# Patient Record
Sex: Female | Born: 1985
Health system: Southern US, Community
[De-identification: ages and names within clinical notes are randomized; demographics above are authoritative.]

## PROBLEM LIST (undated history)

## (undated) DIAGNOSIS — D219 Benign neoplasm of connective and other soft tissue, unspecified: Secondary | ICD-10-CM

## (undated) DIAGNOSIS — O1490 Unspecified pre-eclampsia, unspecified trimester: Secondary | ICD-10-CM

## (undated) DIAGNOSIS — B009 Herpesviral infection, unspecified: Secondary | ICD-10-CM

## (undated) HISTORY — DX: Unspecified pre-eclampsia, unspecified trimester: O14.90

## (undated) HISTORY — DX: Benign neoplasm of connective and other soft tissue, unspecified: D21.9

## (undated) HISTORY — PX: NO PAST SURGERIES: SHX2092

## (undated) HISTORY — DX: Herpesviral infection, unspecified: B00.9

---

## 2017-04-24 ENCOUNTER — Emergency Department (HOSPITAL_COMMUNITY)
Admission: EM | Admit: 2017-04-24 | Discharge: 2017-04-24 | Disposition: A | Discharge status: Home / Self Care | Payer: Medicaid Other | Attending: Emergency Medicine | Admitting: Emergency Medicine

## 2017-04-24 ENCOUNTER — Encounter (HOSPITAL_COMMUNITY): Payer: Self-pay

## 2017-04-24 DIAGNOSIS — I10 Essential (primary) hypertension: Secondary | ICD-10-CM | POA: Insufficient documentation

## 2017-04-24 DIAGNOSIS — K0889 Other specified disorders of teeth and supporting structures: Secondary | ICD-10-CM

## 2017-04-24 DIAGNOSIS — K029 Dental caries, unspecified: Secondary | ICD-10-CM | POA: Diagnosis not present

## 2017-04-24 DIAGNOSIS — F17210 Nicotine dependence, cigarettes, uncomplicated: Secondary | ICD-10-CM | POA: Insufficient documentation

## 2017-04-24 MED ORDER — BUPIVACAINE HCL (PF) 0.25 % IJ SOLN: 2.0000 mL | Freq: Once | INTRAMUSCULAR | Status: DC

## 2017-04-24 MED ORDER — BUPIVACAINE HCL (PF) 0.5 % IJ SOLN
2.0000 mL | Freq: Once | INTRAMUSCULAR | Status: AC
  Administered 2017-04-24: 2 mL
  Filled 2017-04-24: qty 10

## 2017-04-24 MED ORDER — ACETAMINOPHEN 500 MG PO TABS
1000.0000 mg | ORAL_TABLET | Freq: Once | ORAL | Status: AC
  Administered 2017-04-24: 1000 mg via ORAL
  Filled 2017-04-24: qty 2

## 2017-04-24 MED ORDER — PENICILLIN V POTASSIUM 500 MG PO TABS: 500.0000 mg | ORAL_TABLET | Freq: Four times a day (QID) | ORAL | 0 refills | Status: AC

## 2017-04-24 MED ORDER — BUPIVACAINE HCL (PF) 0.5 % IJ SOLN
2.0000 mL | Freq: Once | INTRAMUSCULAR | Status: DC
  Filled 2017-04-24: qty 10

## 2017-04-24 MED ORDER — KETOROLAC TROMETHAMINE 60 MG/2ML IM SOLN
60.0000 mg | Freq: Once | INTRAMUSCULAR | Status: AC
  Administered 2017-04-24: 60 mg via INTRAMUSCULAR
  Filled 2017-04-24 (×2): qty 2

## 2017-04-24 NOTE — ED Triage Notes (Signed)
Pt presents with L lower tooth pain x 2 days.  Pt applied clove oil that numbed area for a short period, reports tooth is cracked.

## 2017-04-24 NOTE — Discharge Instructions (Signed)
Take tylenol 1000mg  4 times a day. (maximum tylenol from all sources) and ibuprofen 400mg  4-6 times daily.

## 2017-04-27 NOTE — ED Provider Notes (Signed)
[facility] Provider Note   CSN: [id number] Arrival date & time: 04/24/17  1143     History   Chief Complaint Chief Complaint  Patient presents with  . Dental Pain    HPI Alicia Spears is a 31 y.o. female.  HPI   31yo female presents with concern for dental pain. Pain has been present for 2 days. It is located lower left molar.  It is a throbbing pain, severe. Unable to eat on that side of her mouth.  Worse with pressure on the area. Pain radiates up towards left ear.  Visiting from [place].   Past Medical History:  Diagnosis Date  . Hypertension     There are no active problems to display for this patient.   History reviewed. No pertinent surgical history.  OB History    No data available       Home Medications    Prior to Admission medications   Medication Sig Start Date End Date Taking? Authorizing Provider  penicillin v potassium (VEETID) 500 MG tablet Take 1 tablet (500 mg total) by mouth 4 (four) times daily. 04/24/17 05/01/17  Gareth Morgan, MD    Family History History reviewed. No pertinent family history.  Social History Social History  Substance Use Topics  . Smoking status: Current Every Day Smoker    Packs/day: 0.50  . Smokeless tobacco: Never Used  . Alcohol use Not on file     Allergies   Patient has no known allergies.   Review of Systems Review of Systems  Constitutional: Negative for fever.  HENT: Positive for dental problem and ear pain. Negative for sore throat, trouble swallowing and voice change.   Eyes: Negative for visual disturbance.  Respiratory: Negative for cough.   Cardiovascular: Negative for chest pain.  Gastrointestinal: Negative for nausea and vomiting.  Genitourinary: Negative for difficulty urinating.  Skin: Negative for rash.  Neurological: Negative for syncope and headaches.     Physical Exam Updated Vital Signs BP 125/80 (BP Location: Right Arm)   Pulse 75   Temp 98.8 F (37.1 C)  (Oral)   Resp 16   Ht 5\' 4"  (1.626 m)   Wt 69.9 kg (154 lb)   LMP 03/28/2017 (Approximate)   SpO2 100%   BMI 26.43 kg/m   Physical Exam  Constitutional: She is oriented to person, place, and time. She appears well-developed and well-nourished. No distress.  HENT:  Head: Normocephalic and atraumatic.  Right Ear: Tympanic membrane is not erythematous.  Left Ear: Tympanic membrane is not erythematous.  Mouth/Throat: Dental caries present. No oropharyngeal exudate.  Severe caries #17, tenderness, no sublingual swelling, no trismus  Eyes: Conjunctivae and EOM are normal.  Neck: Normal range of motion.  Cardiovascular: Normal rate, regular rhythm, normal heart sounds and intact distal pulses.  Exam reveals no gallop and no friction rub.   No murmur heard. Pulmonary/Chest: Effort normal and breath sounds normal. No respiratory distress. She has no wheezes. She has no rales.  Neurological: She is alert and oriented to person, place, and time.  Skin: Skin is warm and dry. No rash noted. She is not diaphoretic. No erythema.  Nursing note and vitals reviewed.    ED Treatments / Results  Labs (all labs ordered are listed, but only abnormal results are displayed) Labs Reviewed - No data to display  EKG  EKG Interpretation None       Radiology No results found.  Procedures Dental Block Date/Time: 04/27/2017 7:18 AM Performed by: Gareth Morgan  Authorized by: Gareth Morgan   Consent:    Consent obtained:  Verbal   Consent given by:  Patient   Risks discussed:  Hematoma, infection, swelling, nerve damage, pain, unsuccessful block, allergic reaction and intravascular injection   Alternatives discussed:  Alternative treatment Indications:    Indications: dental pain   Location:    Block type:  Inferior alveolar   Laterality:  Left Procedure details (see MAR for exact dosages):    Needle gauge:  25 G   Anesthetic injected:  Bupivacaine 0.25% WITH epi   Injection  procedure:  Anatomic landmarks identified, introduced needle, anatomic landmarks palpated, negative aspiration for blood and incremental injection Post-procedure details:    Outcome:  Pain improved   Patient tolerance of procedure:  Tolerated well, no immediate complications   (including critical care time)  Medications Ordered in ED Medications  acetaminophen (TYLENOL) tablet 1,000 mg (1,000 mg Oral Given 04/24/17 1325)  ketorolac (TORADOL) injection 60 mg (60 mg Intramuscular Given 04/24/17 1347)  bupivacaine (MARCAINE) 0.5 % injection 2 mL (2 mLs Infiltration Given 04/24/17 1350)     Initial Impression / Assessment and Plan / ED Course  I have reviewed the triage vital signs and the nursing notes.  Pertinent labs & imaging results that were available during my care of the patient were reviewed by me and considered in my medical decision making (see chart for details).     32yo female presents with concern for dental pain.  Given toradol, tylenol, performed dental block with some relief. Severe caries, suspect periapical abscess by history. No signs of drainable abscess, nor extension of infection on hx or exam. Given rx for penicillin. Recommend ibuprofen/tylenol for pain. Provided dental resources. Patient discharged in stable condition with understanding of reasons to return.   Final Clinical Impressions(s) / ED Diagnoses   Final diagnoses:  Pain, dental  Dental caries    New Prescriptions Discharge Medication List as of 04/24/2017 12:46 PM    START taking these medications   Details  penicillin v potassium (VEETID) 500 MG tablet Take 1 tablet (500 mg total) by mouth 4 (four) times daily., Starting Fri 04/24/2017, Until Fri 05/01/2017, Print         Gareth Morgan, MD 04/27/17 [phone]

## 2018-09-30 ENCOUNTER — Ambulatory Visit (INDEPENDENT_AMBULATORY_CARE_PROVIDER_SITE_OTHER): Payer: Self-pay | Admitting: Physician Assistant

## 2018-10-25 ENCOUNTER — Encounter: Payer: Self-pay | Admitting: Obstetrics & Gynecology

## 2018-10-26 ENCOUNTER — Other Ambulatory Visit: Payer: Self-pay

## 2018-10-26 ENCOUNTER — Encounter (INDEPENDENT_AMBULATORY_CARE_PROVIDER_SITE_OTHER): Payer: Self-pay | Admitting: Family Medicine

## 2018-10-26 ENCOUNTER — Ambulatory Visit (INDEPENDENT_AMBULATORY_CARE_PROVIDER_SITE_OTHER): Payer: Self-pay | Admitting: Family Medicine

## 2018-10-26 VITALS — BP 130/76 | HR 82 | Temp 98.2°F | Ht 65.0 in | Wt 131.8 lb

## 2018-10-26 DIAGNOSIS — R7303 Prediabetes: Secondary | ICD-10-CM

## 2018-10-26 DIAGNOSIS — I10 Essential (primary) hypertension: Secondary | ICD-10-CM | POA: Insufficient documentation

## 2018-10-26 DIAGNOSIS — Z79899 Other long term (current) drug therapy: Secondary | ICD-10-CM

## 2018-10-26 DIAGNOSIS — O169 Unspecified maternal hypertension, unspecified trimester: Secondary | ICD-10-CM | POA: Insufficient documentation

## 2018-10-26 DIAGNOSIS — Z131 Encounter for screening for diabetes mellitus: Secondary | ICD-10-CM

## 2018-10-26 DIAGNOSIS — D500 Iron deficiency anemia secondary to blood loss (chronic): Secondary | ICD-10-CM

## 2018-10-26 LAB — POCT GLYCOSYLATED HEMOGLOBIN (HGB A1C): Hemoglobin A1C: 5.7 % — AB (ref 4.0–5.6)

## 2018-10-26 MED ORDER — HYDROCHLOROTHIAZIDE 12.5 MG PO TABS: 12.5000 mg | ORAL_TABLET | Freq: Every day | ORAL | 6 refills | Status: DC

## 2018-10-26 MED ORDER — LOSARTAN POTASSIUM 25 MG PO TABS: 25.0000 mg | ORAL_TABLET | Freq: Every day | ORAL | 6 refills | Status: DC

## 2018-10-26 MED ORDER — NIFEDIPINE ER OSMOTIC RELEASE 30 MG PO TB24: 30.0000 mg | ORAL_TABLET | Freq: Every day | ORAL | 6 refills | Status: DC

## 2018-10-26 NOTE — Progress Notes (Signed)
Subjective:    Patient ID: Alicia Spears, female    DOB: September 28, 1986, 33 y.o.   MRN: [id number]  HPI       33 yo female new to the practice.  Patient reports that she has high blood pressure which developed during pregnancy initially.  Patient is currently on losartan, hydrochlorothiazide and nifedipine.  Patient also reports history of HSV for which she takes Valtrex.  Patient would also like to be screened for diabetes/prediabetes.  Patient states that overall she feels well at today's visit.  Patient denies any headaches or dizziness related to her blood pressure.  Patient does have some occasional fatigue.  Patient does have a history of anemia related to heavy menses as patient also has uterine fibroids.  Patient denies any increased thirst or urinary frequency.  No current abdominal pain, no nausea/vomiting/diarrhea or constipation.  Past Medical History:  Diagnosis Date  . Fibroids   . Herpes simplex virus (HSV) infection   . Hypertension in pregnancy, preeclampsia    Past Surgical History:  Procedure Laterality Date  . NO PAST SURGERIES     Family History  Problem Relation Age of Onset  . Hypertension Mother   . CVA Mother   . Colon cancer Paternal Grandfather    Social History   Tobacco Use  . Smoking status: Current Every Day Smoker    Packs/day: 0.50  . Smokeless tobacco: Never Used  Substance Use Topics  . Alcohol use: Yes    Comment: occasionally  . Drug use: Not Currently    Types: Marijuana  . No Known Allergies    Review of Systems  Constitutional: Positive for fatigue. Negative for chills and fever.  HENT: Negative for hearing loss and trouble swallowing.   Respiratory: Negative for cough and shortness of breath.   Cardiovascular: Negative for chest pain, palpitations and leg swelling.  Gastrointestinal: Negative for abdominal pain, constipation, diarrhea and nausea.  Endocrine: Negative for polydipsia, polyphagia and polyuria.  Genitourinary: Negative  for dysuria and frequency.  Musculoskeletal: Negative for arthralgias, back pain and gait problem.  Neurological: Negative for dizziness and headaches.  Hematological: Negative for adenopathy. Does not bruise/bleed easily.       Objective:   Physical Exam Vitals signs and nursing note reviewed.  Constitutional:      Appearance: Normal appearance. She is normal weight.  HENT:     Right Ear: Tympanic membrane, ear canal and external ear normal.     Left Ear: Tympanic membrane, ear canal and external ear normal.     Nose: Nose normal. No congestion.     Mouth/Throat:     Mouth: Mucous membranes are moist.     Pharynx: Oropharynx is clear.  Eyes:     Extraocular Movements: Extraocular movements intact.     Conjunctiva/sclera: Conjunctivae normal.     Pupils: Pupils are equal, round, and reactive to light.  Neck:     Musculoskeletal: Normal range of motion and neck supple.  Cardiovascular:     Rate and Rhythm: Normal rate and regular rhythm.  Pulmonary:     Effort: Pulmonary effort is normal. No respiratory distress.     Breath sounds: Normal breath sounds.  Abdominal:     General: There is no distension.     Palpations: Abdomen is soft.     Tenderness: There is no abdominal tenderness. There is no right CVA tenderness, left CVA tenderness, guarding or rebound.  Musculoskeletal: Normal range of motion.        General:  No swelling or tenderness.     Right lower leg: No edema.     Left lower leg: No edema.  Skin:    General: Skin is warm and dry.  Neurological:     General: No focal deficit present.     Mental Status: She is alert and oriented to person, place, and time.  Psychiatric:        Mood and Affect: Mood normal.        Behavior: Behavior normal.        Thought Content: Thought content normal.        Judgment: Judgment normal.    BP 130/76 (BP Location: Right Arm, Patient Position: Sitting, Cuff Size: Normal)   Pulse 82   Temp 98.2 F (36.8 C) (Oral)   Ht 5\' 5"   (1.651 m)   Wt 131 lb 12.8 oz (59.8 kg)   LMP 10/15/2018 (Approximate)   SpO2 100%   BMI 21.93 kg/m         Assessment & Plan:  1. Essential hypertension Patient will have complete metabolic panel and lipid panel in follow-up of hypertension.  Patient's blood pressure is well controlled at 130/76 with goal blood pressure being 130/80.  Patient will continue her current nifedipine, losartan and hydrochlorothiazide.  DASH diet also recommended. - Comprehensive metabolic panel - Lipid Panel - NIFEdipine (PROCARDIA-XL/NIFEDICAL-XL) 30 MG 24 hr tablet; Take 1 tablet (30 mg total) by mouth at bedtime.  Dispense: 30 tablet; Refill: 6 - losartan (COZAAR) 25 MG tablet; Take 1 tablet (25 mg total) by mouth daily.  Dispense: 30 tablet; Refill: 6 - hydrochlorothiazide (HYDRODIURIL) 12.5 MG tablet; Take 1 tablet (12.5 mg total) by mouth daily.  Dispense: 30 tablet; Refill: 6  2. Screening for diabetes mellitus Patient with hypertension and requested screening for diabetes.  Patient with hemoglobin A1c of 5.7 and discussed with the patient that normal hemoglobin A1c is 4.0-5.6 and that a value of 5.7 can indicate an increased future risk of becoming diabetic - HgB A1c  3. Iron deficiency anemia due to chronic blood loss Patient reports history of iron deficiency anemia due to blood loss and patient with history of uterine fibroids.  Patient will have CBC at today's visit and will be notified if further intervention such as iron therapy are warranted based on her results - CBC with Differential  4. Prediabetes Patient with hemoglobin A1c of 5.7 which indicates an increased risk of patient becoming diabetic.  Patient would like to try making changes in her diet such as eliminating concentrated sweets, sodas and juices.  Suggest 52-month repeat of hemoglobin A1c and if A1c is further elevated at that time, patient should consider medication such as metformin to help with glucose regulation.  Patient will  have CMP in follow-up of prediabetes at today's visit - Comprehensive metabolic panel  5. Encounter for long-term (current) use of medications Patient will have CMP at today's visit in follow-up of long-term use of medications for the treatment of hypertension - Comprehensive metabolic panel  *Influenza immunization was offered at today's visit but declined by the patient  An After Visit Summary was printed and given to the patient. Allergies as of 10/26/2018   No Known Allergies     Medication List       Accurate as of October 26, 2018 11:59 PM. Always use your most recent med list.        hydrochlorothiazide 12.5 MG tablet Commonly known as:  HYDRODIURIL Take 1 tablet (12.5 mg total) by  mouth daily.   losartan 25 MG tablet Commonly known as:  COZAAR Take 1 tablet (25 mg total) by mouth daily.   NIFEdipine 30 MG 24 hr tablet Commonly known as:  PROCARDIA-XL/NIFEDICAL-XL Take 1 tablet (30 mg total) by mouth at bedtime.   valACYclovir 500 MG tablet Commonly known as:  VALTREX Take 500 mg by mouth 2 (two) times daily.      Return in about 6 months (around 04/26/2019) for HTN, prediabetes, anemia.

## 2018-10-26 NOTE — Patient Instructions (Signed)
Prediabetes Prediabetes is the condition of having a blood sugar (blood glucose) level that is higher than it should be, but not high enough for you to be diagnosed with type 2 diabetes. Having prediabetes puts you at risk for developing type 2 diabetes (type 2 diabetes mellitus). Prediabetes may be called impaired glucose tolerance or impaired fasting glucose. Prediabetes usually does not cause symptoms. Your health care provider can diagnose this condition with blood tests. You may be tested for prediabetes if you are overweight and if you have at least one other risk factor for prediabetes. What is blood glucose, and how is it measured? Blood glucose refers to the amount of glucose in your bloodstream. Glucose comes from eating foods that contain sugars and starches (carbohydrates), which the body breaks down into glucose. Your blood glucose level may be measured in mg/dL (milligrams per deciliter) or mmol/L (millimoles per liter). Your blood glucose may be checked with one or more of the following blood tests:  A fasting blood glucose (FBG) test. You will not be allowed to eat (you will fast) for 8 hours or longer before a blood sample is taken. ? A normal range for FBG is 70-100 mg/dl (3.9-5.6 mmol/L).  An A1c (hemoglobin A1c) blood test. This test provides information about blood glucose control over the previous 2?3months.  An oral glucose tolerance test (OGTT). This test measures your blood glucose at two times: ? After fasting. This is your baseline level. ? Two hours after you drink a beverage that contains glucose. You may be diagnosed with prediabetes:  If your FBG is 100?125 mg/dL (5.6-6.9 mmol/L).  If your A1c level is 5.7?6.4%.  If your OGTT result is 140?199 mg/dL (7.8-11 mmol/L). These blood tests may be repeated to confirm your diagnosis. How can this condition affect me? The pancreas produces a hormone (insulin) that helps to move glucose from the bloodstream into cells.  When cells in the body do not respond properly to insulin that the body makes (insulin resistance), excess glucose builds up in the blood instead of going into cells. As a result, high blood glucose (hyperglycemia) can develop, which can cause many complications. Hyperglycemia is a symptom of prediabetes. Having high blood glucose for a long time is dangerous. Too much glucose in your blood can damage your nerves and blood vessels. Long-term damage can lead to complications from diabetes, which may include:  Heart disease.  Stroke.  Blindness.  Kidney disease.  Depression.  Poor circulation in the feet and legs, which could lead to surgical removal (amputation) in severe cases. What can increase my risk? Risk factors for prediabetes include:  Having a family member with type 2 diabetes.  Being overweight or obese.  Being older than age 45.  Being of American Indian, African-American, Hispanic/Latino, or Asian/Pacific Islander descent.  Having an inactive (sedentary) lifestyle.  Having a history of heart disease.  History of gestational diabetes or polycystic ovary syndrome (PCOS), in women.  Having low levels of good cholesterol (HDL-C) or high levels of blood fats (triglycerides).  Having high blood pressure. What actions can I take to prevent diabetes?      Be physically active. ? Do moderate-intensity physical activity for 30 or more minutes on 5 or more days of the week, or as much as told by your health care provider. This could be brisk walking, biking, or water aerobics. ? Ask your health care provider what activities are safe for you. A mix of physical activities may be best, such as   walking, swimming, cycling, and strength training.  Lose weight as told by your health care provider. ? Losing 5-7% of your body weight can reverse insulin resistance. ? Your health care provider can determine how much weight loss is best for you and can help you lose weight  safely.  Follow a healthy meal plan. This includes eating lean proteins, complex carbohydrates, fresh fruits and vegetables, low-fat dairy products, and healthy fats. ? Follow instructions from your health care provider about eating or drinking restrictions. ? Make an appointment to see a diet and nutrition specialist (registered dietitian) to help you create a healthy eating plan that is right for you.  Do not smoke or use any tobacco products, such as cigarettes, chewing tobacco, and e-cigarettes. If you need help quitting, ask your health care provider.  Take over-the-counter and prescription medicines as told by your health care provider. You may be prescribed medicines that help lower the risk of type 2 diabetes.  Keep all follow-up visits as told by your health care provider. This is important. Summary  Prediabetes is the condition of having a blood sugar (blood glucose) level that is higher than it should be, but not high enough for you to be diagnosed with type 2 diabetes.  Having prediabetes puts you at risk for developing type 2 diabetes (type 2 diabetes mellitus).  To help prevent type 2 diabetes, make lifestyle changes such as being physically active and eating a healthy diet. Lose weight as told by your health care provider. This information is not intended to replace advice given to you by your health care provider. Make sure you discuss any questions you have with your health care provider. Document Released: 01/14/2016 Document Revised: 05/12/2017 Document Reviewed: 11/13/2015 Elsevier Interactive Patient Education  2019 [place] DASH stands for "Dietary Approaches to Stop Hypertension." The DASH eating plan is a healthy eating plan that has been shown to reduce high blood pressure (hypertension). It may also reduce your risk for type 2 diabetes, heart disease, and stroke. The DASH eating plan may also help with weight loss. What are tips for following  this plan?  General guidelines  Avoid eating more than 2,300 mg (milligrams) of salt (sodium) a day. If you have hypertension, you may need to reduce your sodium intake to 1,500 mg a day.  Limit alcohol intake to no more than 1 drink a day for nonpregnant women and 2 drinks a day for men. One drink equals 12 oz of beer, 5 oz of wine, or 1 oz of hard liquor.  Work with your health care provider to maintain a healthy body weight or to lose weight. Ask what an ideal weight is for you.  Get at least 30 minutes of exercise that causes your heart to beat faster (aerobic exercise) most days of the week. Activities may include walking, swimming, or biking.  Work with your health care provider or diet and nutrition specialist (dietitian) to adjust your eating plan to your individual calorie needs. Reading food labels   Check food labels for the amount of sodium per serving. Choose foods with less than 5 percent of the Daily Value of sodium. Generally, foods with less than 300 mg of sodium per serving fit into this eating plan.  To find whole grains, look for the word "whole" as the first word in the ingredient list. Shopping  Buy products labeled as "low-sodium" or "no salt added."  Buy fresh foods. Avoid canned foods and premade or  frozen meals. Cooking  Avoid adding salt when cooking. Use salt-free seasonings or herbs instead of table salt or sea salt. Check with your health care provider or pharmacist before using salt substitutes.  Do not fry foods. Cook foods using healthy methods such as baking, boiling, grilling, and broiling instead.  Cook with heart-healthy oils, such as olive, canola, soybean, or sunflower oil. Meal planning  Eat a balanced diet that includes: ? 5 or more servings of fruits and vegetables each day. At each meal, try to fill half of your plate with fruits and vegetables. ? Up to 6-8 servings of whole grains each day. ? Less than 6 oz of lean meat, poultry, or  fish each day. A 3-oz serving of meat is about the same size as a deck of cards. One egg equals 1 oz. ? 2 servings of low-fat dairy each day. ? A serving of nuts, seeds, or beans 5 times each week. ? Heart-healthy fats. Healthy fats called Omega-3 fatty acids are found in foods such as flaxseeds and coldwater fish, like sardines, salmon, and mackerel.  Limit how much you eat of the following: ? Canned or prepackaged foods. ? Food that is high in trans fat, such as fried foods. ? Food that is high in saturated fat, such as fatty meat. ? Sweets, desserts, sugary drinks, and other foods with added sugar. ? Full-fat dairy products.  Do not salt foods before eating.  Try to eat at least 2 vegetarian meals each week.  Eat more home-cooked food and less restaurant, buffet, and fast food.  When eating at a restaurant, ask that your food be prepared with less salt or no salt, if possible. What foods are recommended? The items listed may not be a complete list. Talk with your dietitian about what dietary choices are best for you. Grains Whole-grain or whole-wheat bread. Whole-grain or whole-wheat pasta. Brown rice. [place]. Bulgur. Whole-grain and low-sodium cereals. Pita bread. Low-fat, low-sodium crackers. Whole-wheat flour tortillas. Vegetables Fresh or frozen vegetables (raw, steamed, roasted, or grilled). Low-sodium or reduced-sodium tomato and vegetable juice. Low-sodium or reduced-sodium tomato sauce and tomato paste. Low-sodium or reduced-sodium canned vegetables. Fruits All fresh, dried, or frozen fruit. Canned fruit in natural juice (without added sugar). Meat and other protein foods Skinless chicken or [place]. Ground chicken or [place]. Pork with fat trimmed off. Fish and seafood. Egg whites. Dried beans, peas, or lentils. Unsalted nuts, nut butters, and seeds. Unsalted canned beans. Lean cuts of beef with fat trimmed off. Low-sodium, lean deli meat. Dairy Low-fat (1%) or  fat-free (skim) milk. Fat-free, low-fat, or reduced-fat cheeses. Nonfat, low-sodium ricotta or cottage cheese. Low-fat or nonfat yogurt. Low-fat, low-sodium cheese. Fats and oils Soft margarine without trans fats. Vegetable oil. Low-fat, reduced-fat, or light mayonnaise and salad dressings (reduced-sodium). Canola, safflower, olive, soybean, and sunflower oils. Avocado. Seasoning and other foods Herbs. Spices. Seasoning mixes without salt. Unsalted popcorn and pretzels. Fat-free sweets. What foods are not recommended? The items listed may not be a complete list. Talk with your dietitian about what dietary choices are best for you. Grains Baked goods made with fat, such as croissants, muffins, or some breads. Dry pasta or rice meal packs. Vegetables Creamed or fried vegetables. Vegetables in a cheese sauce. Regular canned vegetables (not low-sodium or reduced-sodium). Regular canned tomato sauce and paste (not low-sodium or reduced-sodium). Regular tomato and vegetable juice (not low-sodium or reduced-sodium). Angie Fava. Olives. Fruits Canned fruit in a light or heavy syrup. Fried fruit. Fruit in cream  or butter sauce. Meat and other protein foods Fatty cuts of meat. Ribs. Fried meat. Berniece Salines. Sausage. Bologna and other processed lunch meats. Salami. Fatback. Hotdogs. Bratwurst. Salted nuts and seeds. Canned beans with added salt. Canned or smoked fish. Whole eggs or egg yolks. Chicken or [place] with skin. Dairy Whole or 2% milk, cream, and half-and-half. Whole or full-fat cream cheese. Whole-fat or sweetened yogurt. Full-fat cheese. Nondairy creamers. Whipped toppings. Processed cheese and cheese spreads. Fats and oils Butter. Stick margarine. Lard. Shortening. Ghee. Bacon fat. Tropical oils, such as coconut, palm kernel, or palm oil. Seasoning and other foods Salted popcorn and pretzels. Onion salt, garlic salt, seasoned salt, table salt, and sea salt. Worcestershire sauce. Tartar sauce. Barbecue  sauce. Teriyaki sauce. Soy sauce, including reduced-sodium. Steak sauce. Canned and packaged gravies. Fish sauce. Oyster sauce. Cocktail sauce. Horseradish that you find on the shelf. Ketchup. Mustard. Meat flavorings and tenderizers. Bouillon cubes. Hot sauce and Tabasco sauce. Premade or packaged marinades. Premade or packaged taco seasonings. Relishes. Regular salad dressings. Where to find more information:  National Heart, Lung, and [place]: https://wilson-eaton.com/  American Heart Association: www.heart.org Summary  The DASH eating plan is a healthy eating plan that has been shown to reduce high blood pressure (hypertension). It may also reduce your risk for type 2 diabetes, heart disease, and stroke.  With the DASH eating plan, you should limit salt (sodium) intake to 2,300 mg a day. If you have hypertension, you may need to reduce your sodium intake to 1,500 mg a day.  When on the DASH eating plan, aim to eat more fresh fruits and vegetables, whole grains, lean proteins, low-fat dairy, and heart-healthy fats.  Work with your health care provider or diet and nutrition specialist (dietitian) to adjust your eating plan to your individual calorie needs. This information is not intended to replace advice given to you by your health care provider. Make sure you discuss any questions you have with your health care provider. Document Released: 09/11/2011 Document Revised: 09/15/2016 Document Reviewed: 09/15/2016 Elsevier Interactive Patient Education  2019 [employer].

## 2018-10-27 LAB — CBC WITH DIFFERENTIAL/PLATELET
Basophils Absolute: 0 x10E3/uL (ref 0.0–0.2)
Basos: 1 %
EOS (ABSOLUTE): 0.2 x10E3/uL (ref 0.0–0.4)
Eos: 4 %
Hematocrit: 40.1 % (ref 34.0–46.6)
Hemoglobin: 13 g/dL (ref 11.1–15.9)
Immature Grans (Abs): 0 x10E3/uL (ref 0.0–0.1)
Immature Granulocytes: 0 %
Lymphocytes Absolute: 1.7 x10E3/uL (ref 0.7–3.1)
Lymphs: 35 %
MCH: 27.9 pg (ref 26.6–33.0)
MCHC: 32.4 g/dL (ref 31.5–35.7)
MCV: 86 fL (ref 79–97)
Monocytes Absolute: 0.4 x10E3/uL (ref 0.1–0.9)
Monocytes: 9 %
Neutrophils Absolute: 2.5 x10E3/uL (ref 1.4–7.0)
Neutrophils: 51 %
Platelets: 256 x10E3/uL (ref 150–450)
RBC: 4.66 x10E6/uL (ref 3.77–5.28)
RDW: 12.9 % (ref 11.7–15.4)
WBC: 4.9 x10E3/uL (ref 3.4–10.8)

## 2018-10-27 LAB — COMPREHENSIVE METABOLIC PANEL WITH GFR
ALT: 14 IU/L (ref 0–32)
AST: 18 IU/L (ref 0–40)
Albumin/Globulin Ratio: 1.5 (ref 1.2–2.2)
Albumin: 4.4 g/dL (ref 3.8–4.8)
Alkaline Phosphatase: 46 IU/L (ref 39–117)
BUN/Creatinine Ratio: 11 (ref 9–23)
BUN: 9 mg/dL (ref 6–20)
Bilirubin Total: 0.5 mg/dL (ref 0.0–1.2)
CO2: 22 mmol/L (ref 20–29)
Calcium: 9.7 mg/dL (ref 8.7–10.2)
Chloride: 101 mmol/L (ref 96–106)
Creatinine, Ser: 0.82 mg/dL (ref 0.57–1.00)
GFR calc Af Amer: 109 mL/min/1.73
GFR calc non Af Amer: 94 mL/min/1.73
Globulin, Total: 2.9 g/dL (ref 1.5–4.5)
Glucose: 81 mg/dL (ref 65–99)
Potassium: 3.7 mmol/L (ref 3.5–5.2)
Sodium: 138 mmol/L (ref 134–144)
Total Protein: 7.3 g/dL (ref 6.0–8.5)

## 2018-10-27 LAB — LIPID PANEL
Chol/HDL Ratio: 2.9 ratio (ref 0.0–4.4)
Cholesterol, Total: 161 mg/dL (ref 100–199)
HDL: 56 mg/dL
LDL Calculated: 92 mg/dL (ref 0–99)
Triglycerides: 63 mg/dL (ref 0–149)
VLDL Cholesterol Cal: 13 mg/dL (ref 5–40)

## 2018-11-02 ENCOUNTER — Telehealth: Payer: Self-pay | Admitting: Family Medicine

## 2018-11-02 ENCOUNTER — Telehealth: Payer: Self-pay | Admitting: *Deleted

## 2018-11-02 NOTE — Telephone Encounter (Signed)
MA left a message with patients mom for her to return the phone call to the office.  Please inform patient of labs being normal and to continue with a healthy diet and exercise.

## 2018-11-02 NOTE — Telephone Encounter (Signed)
-----   Message from Alicia Blackbird, MD sent at 11/01/2018 11:26 PM EST ----- Please notify patient that her complete blood count, complete metabolic panel and lipid panel were normal.  Continue a healthy, low-fat diet and regular exercise such as walking

## 2018-11-02 NOTE — Telephone Encounter (Signed)
Pt called and received lab results given by Nurse, she had no questions or concerns

## 2018-11-04 ENCOUNTER — Other Ambulatory Visit (HOSPITAL_COMMUNITY)
Admission: RE | Admit: 2018-11-04 | Discharge: 2018-11-04 | Disposition: A | Discharge status: Home / Self Care | Payer: Medicaid Other | Source: Ambulatory Visit | Attending: Obstetrics & Gynecology | Admitting: Obstetrics & Gynecology

## 2018-11-04 ENCOUNTER — Ambulatory Visit (INDEPENDENT_AMBULATORY_CARE_PROVIDER_SITE_OTHER): Payer: Medicaid Other | Admitting: Obstetrics & Gynecology

## 2018-11-04 ENCOUNTER — Encounter: Payer: Self-pay | Admitting: Obstetrics & Gynecology

## 2018-11-04 VITALS — BP 118/73 | HR 93 | Ht 65.0 in | Wt 127.1 lb

## 2018-11-04 DIAGNOSIS — Z0000 Encounter for general adult medical examination without abnormal findings: Secondary | ICD-10-CM

## 2018-11-04 DIAGNOSIS — Z124 Encounter for screening for malignant neoplasm of cervix: Secondary | ICD-10-CM | POA: Insufficient documentation

## 2018-11-04 DIAGNOSIS — D251 Intramural leiomyoma of uterus: Secondary | ICD-10-CM

## 2018-11-04 DIAGNOSIS — Z113 Encounter for screening for infections with a predominantly sexual mode of transmission: Secondary | ICD-10-CM

## 2018-11-04 DIAGNOSIS — Z01419 Encounter for gynecological examination (general) (routine) without abnormal findings: Secondary | ICD-10-CM

## 2018-11-04 NOTE — Progress Notes (Signed)
Subjective:     Alicia Spears is a 33 y.o. female here for a routine exam. G2P2 LMP 10/15/2018 Current complaints: pt reports that one week after her cycle she has some brown discharge.  Not sexually active for 1 year. Pos tobacco use 6 cigs per day. H/o fibroids.   H/o trich. H/o genital herpes. Cycles  Heavy first 2 days - changes pads every 1-2 hours. Pt reprots increased in the size of the masses during her menses and pain. .     Gynecologic History Patient's last menstrual period was 10/15/2018 (approximate). Contraception: abstinence Last Pap: 2017- normal Last mammogram: 2006 for lump- benign  Obstetric History OB History  Gravida Para Term Preterm AB Living  2 2 2     2   SAB TAB Ectopic Multiple Live Births          2    # Outcome Date GA Lbr Len/2nd Weight Sex Delivery Anes PTL Lv  2 Term 12/19/16    M Vag-Spont   LIV  1 Term 08/19/04    F Vag-Spont   LIV   The following portions of the patient's history were reviewed and updated as appropriate: allergies, current medications, past family history, past medical history, past social history, past surgical history and problem list.  Review of Systems Pertinent items are noted in HPI.    Objective:  BP 118/73   Pulse 93   Ht 5\' 5"  (1.651 m)   Wt 127 lb 1.6 oz (57.7 kg)   LMP 10/15/2018 (Approximate)   BMI 21.15 kg/m   General Appearance:    Alert, cooperative, no distress, appears stated age  Head:    Normocephalic, without obvious abnormality, atraumatic  Eyes:    conjunctiva/corneas clear, EOM's intact, both eyes  Ears:    Normal external ear canals, both ears  Nose:   Nares normal, septum midline, mucosa normal, no drainage    or sinus tenderness  Throat:   Lips, mucosa, and tongue normal; teeth and gums normal  Neck:   Supple, symmetrical, trachea midline, no adenopathy;    thyroid:  no enlargement/tenderness/nodules  Back:     Symmetric, no curvature, ROM normal, no CVA tenderness  Lungs:     Clear to auscultation  bilaterally, respirations unlabored  Chest Wall:    No tenderness or deformity   Heart:    Regular rate and rhythm, S1 and S2 normal, no murmur, rub   or gallop  Breast Exam:    No tenderness, masses, or nipple abnormality  Abdomen:     Soft, non-tender, bowel sounds active all four quadrants,    no masses, no organomegaly  Genitalia:    Normal female without lesion, discharge or tenderness; enlarged uterus 12-14 weeks sized very mobile with good descensus.      Extremities:   Extremities normal, atraumatic, no cyanosis or edema  Pulses:   2+ and symmetric all extremities  Skin:   Skin color, texture, turgor normal, no rashes or lesions   CBC    Component Value Date/Time   WBC 4.9 10/26/2018 1029   RBC 4.66 10/26/2018 1029   HGB 13.0 10/26/2018 1029   HCT 40.1 10/26/2018 1029   PLT 256 10/26/2018 1029   MCV 86 10/26/2018 1029   MCH 27.9 10/26/2018 1029   MCHC 32.4 10/26/2018 1029   RDW 12.9 10/26/2018 1029   LYMPHSABS 1.7 10/26/2018 1029   EOSABS 0.2 10/26/2018 1029   BASOSABS 0.0 10/26/2018 1029      Assessment:  Healthy female exam.   STI screen Fibroid uterus- symptomatic    Plan:    Follow up in: 1 year.    STI screen  Fibroids- symptomatic.  Labs: RPR, Hep, HIV  F/u PAP F/u in 6 weeks for hyst consult  Hallee Mckenny L. Harraway-Smith, M.D., Cherlynn June

## 2018-11-04 NOTE — Patient Instructions (Signed)
Hysterectomy Information  A hysterectomy is a surgery to remove your uterus. After surgery, you will no longer have periods. Also, you will no longer be able to get pregnant. Reasons for this surgery You may have this surgery if:  You have bleeding in your vagina: ? That is not normal. ? That does not stop, or that keeps coming back.  You have long-term (chronic) pain in your lower belly (pelvic area).  The lining of your uterus grows outside of the uterus (endometriosis).  The lining of your uterus grows in the muscle of the uterus (adenomyosis).  Your uterus falls down into your vagina (prolapse).  You have a growth in your uterus that causes problems (uterine fibroids).  You have cells that could turn into cancer (precancerous cells).  You have cancer of the uterus or cervix. Types of hysterectomies There are 3 types of hysterectomies. Depending on the type, the surgery will:  Remove the top part of the uterus (supracervical).  Remove the uterus and the cervix (total).  Remove the uterus, cervix, and tissue that holds the uterus in place (radical). Ways a hysterectomy can be done This surgery may be done in one of these ways:  A cut (incision) is made in the belly (abdomen). The uterus is taken out through the cut.  A cut is made in the vagina. The uterus is taken out through the cut.  Three or four cuts are made in the belly. A device with a camera is put through one of the cuts. The uterus is cut into pieces and taken out through the cuts or the vagina.  Three or four cuts are made in the belly. A device with a camera is put through one of the cuts. The uterus is taken out through the vagina.  Three or four cuts are made in the belly. A computer helps control the surgical tools. The uterus is cut into small pieces. The pieces are taken out through the cuts or through the vagina. Talk with your doctor about which way is best for you. Risks of hysterectomy Generally,  this surgery is safe. However, problems can happen, including:  Bleeding.  Needing donated blood (transfusion).  Blood clots.  Infection.  Damage to other structures or organs.  Allergic reactions.  Needing to switch to a different type of surgery. What to expect after surgery  You will be given pain medicine.  You will need to stay in the hospital for 1-2 days.  Follow your doctor's instructions about: ? Exercising. ? Driving. ? What activities are safe for you.  You will need to have someone with you at home for 3-5 days.  You will need to see your doctor after 2-4 weeks.  You may get hot flashes, have night sweats, and have trouble sleeping.  You may need to have Pap tests if your surgery was related to cancer. Talk with your doctor about how often you need Pap tests. Questions to ask your doctor  Do I need this surgery? Do I have other treatment options?  What are my options for this surgery?  What needs to be removed?  What are the risks?  What are the benefits?  How long will I need to stay in the hospital?  How long will I need to recover?  What symptoms can I expect after the procedure? Summary  A hysterectomy is a surgery to remove your uterus. After surgery, you will no longer have periods. Also, you will no longer be able to   get pregnant. °· Talk with your doctor about which type of hysterectomy is best for you. °This information is not intended to replace advice given to you by your health care provider. Make sure you discuss any questions you have with your health care provider. °Document Released: 12/15/2011 Document Revised: 12/23/2016 Document Reviewed: 12/23/2016 °Elsevier Interactive Patient Education © 2019 Elsevier Inc. ° °

## 2018-11-05 LAB — RPR: RPR Ser Ql: NONREACTIVE

## 2018-11-05 LAB — HEPATITIS B SURFACE ANTIGEN: Hepatitis B Surface Ag: NEGATIVE

## 2018-11-05 LAB — CBC
HEMATOCRIT: 41 % (ref 34.0–46.6)
HEMOGLOBIN: 13.4 g/dL (ref 11.1–15.9)
MCH: 27.9 pg (ref 26.6–33.0)
MCHC: 32.7 g/dL (ref 31.5–35.7)
MCV: 85 fL (ref 79–97)
Platelets: 285 10*3/uL (ref 150–450)
RBC: 4.8 x10E6/uL (ref 3.77–5.28)
RDW: 12.7 % (ref 11.7–15.4)
WBC: 5.1 10*3/uL (ref 3.4–10.8)

## 2018-11-05 LAB — HEPATITIS C ANTIBODY: Hep C Virus Ab: <0.1 s/co ratio (ref 0.0–0.9)

## 2018-11-05 LAB — HIV ANTIBODY (ROUTINE TESTING W REFLEX): HIV Screen 4th Generation wRfx: NONREACTIVE

## 2018-11-08 LAB — CYTOLOGY - PAP
Bacterial vaginitis: NEGATIVE
CHLAMYDIA, DNA PROBE: NEGATIVE
Candida vaginitis: NEGATIVE
Diagnosis: NEGATIVE
HPV (WINDOPATH): NOT DETECTED
Neisseria Gonorrhea: NEGATIVE
TRICH (WINDOWPATH): NEGATIVE

## 2018-11-19 MED FILL — NIFEDIPINE ER 30 MG TABLET: 30 | 30 days supply | Qty: 30 | Fill #0

## 2018-11-19 MED FILL — HYDROCHLOROTHIAZIDE 12.5 MG: 12.5 | 30 days supply | Qty: 30 | Fill #0

## 2018-11-19 MED FILL — LOSARTAN POTASSIUM 25 MG TA: 25 | 30 days supply | Qty: 30 | Fill #0

## 2018-12-08 ENCOUNTER — Encounter (INDEPENDENT_AMBULATORY_CARE_PROVIDER_SITE_OTHER): Payer: Self-pay | Admitting: Family Medicine

## 2018-12-17 MED FILL — LOSARTAN POTASSIUM 25 MG TA: 25 | 90 days supply | Qty: 90 | Fill #1

## 2018-12-17 MED FILL — HYDROCHLOROTHIAZIDE 12.5 MG: 12.5 | 90 days supply | Qty: 90 | Fill #1

## 2018-12-17 MED FILL — NIFEDIPINE ER 30 MG TABLET: 30 | 90 days supply | Qty: 90 | Fill #1

## 2018-12-27 ENCOUNTER — Telehealth: Payer: Self-pay | Admitting: Obstetrics & Gynecology

## 2018-12-27 NOTE — Telephone Encounter (Signed)
Called the patient to inform of the cancellation due to the nature of the appointment being non-urgent.

## 2018-12-28 ENCOUNTER — Ambulatory Visit: Payer: Medicaid Other | Admitting: Obstetrics & Gynecology

## 2019-01-12 ENCOUNTER — Other Ambulatory Visit: Payer: Self-pay | Admitting: Family Medicine

## 2019-01-12 NOTE — Telephone Encounter (Signed)
1) Medication(s) Requested (by name): valACYclovir (VALTREX) 500 MG tablet  2) Pharmacy of Choice: [facility] verified address for delivery  3) Special Requests: Pt states she spoke to her dr on refilling this last visit   Approved medications will be sent to the pharmacy, we will reach out if there is an issue.  Requests made after 3pm may not be addressed until the following business day!  If a patient is unsure of the name of the medication(s) please note and ask patient to call back when they are able to provide all info, do not send to responsible party until all information is available!

## 2019-01-14 MED ORDER — VALACYCLOVIR HCL 500 MG PO TABS: 500.0000 mg | ORAL_TABLET | Freq: Two times a day (BID) | ORAL | 0 refills | Status: DC

## 2019-01-15 MED FILL — VALACYCLOVIR HCL 500 MG TAB: 500 | 7 days supply | Qty: 14 | Fill #0

## 2019-01-17 ENCOUNTER — Telehealth: Payer: Self-pay | Admitting: Family Medicine

## 2019-01-17 NOTE — Telephone Encounter (Signed)
Patients call returned.  Patient identified by name and date of birth.  Patient having second outbreak of Herpes 2.  Patient concerned that this is second outbreak this year. Patient prescribed valtrex but does not have refill.  Was wanting to know if she needs one and or is there a different medication.  Patient advised that Doctor would be notified.  Nurse reviewed healthy lifestyle living  Patient acknowledged understanding of advise.

## 2019-01-17 NOTE — Telephone Encounter (Signed)
New Message   Pt calling states she has genital herpes with 2 outbreaks this year. Pt is concerned with the frequency of outbreaks and would like to speak with someone. Please f/u

## 2019-01-19 NOTE — Telephone Encounter (Signed)
I received a RX request for valtrex earlier this week which I sent in

## 2019-03-14 ENCOUNTER — Other Ambulatory Visit: Payer: Self-pay | Admitting: Family Medicine

## 2019-03-14 MED FILL — NIFEDIPINE ER 30 MG TABLET: 30 | 90 days supply | Qty: 90 | Fill #2

## 2019-03-14 MED FILL — LOSARTAN POTASSIUM 25 MG TA: 25 | 90 days supply | Qty: 90 | Fill #2

## 2019-03-14 MED FILL — HYDROCHLOROTHIAZIDE 12.5 MG: 12.5 | 90 days supply | Qty: 90 | Fill #2

## 2019-04-05 ENCOUNTER — Encounter

## 2019-06-14 ENCOUNTER — Telehealth (INDEPENDENT_AMBULATORY_CARE_PROVIDER_SITE_OTHER): Payer: Self-pay | Admitting: Family Medicine

## 2019-06-14 NOTE — Telephone Encounter (Signed)
1) Medication(s) Requested (by name): -NIFEdipine (PROCARDIA-XL/NIFEDICAL-XL) 30 MG 24 hr tablet   2) Pharmacy of Choice: -[facility], [place] [address]) Special Requests: Until follow up appt on 06/22/2019  Approved medications will be sent to the pharmacy, we will reach out if there is an issue.  Requests made after 3pm may not be addressed until the following business day!

## 2019-06-15 ENCOUNTER — Other Ambulatory Visit: Payer: Self-pay | Admitting: Family Medicine

## 2019-06-15 ENCOUNTER — Other Ambulatory Visit (INDEPENDENT_AMBULATORY_CARE_PROVIDER_SITE_OTHER): Payer: Self-pay | Admitting: Family Medicine

## 2019-06-15 DIAGNOSIS — I10 Essential (primary) hypertension: Secondary | ICD-10-CM

## 2019-06-15 MED ORDER — NIFEDIPINE ER OSMOTIC RELEASE 30 MG PO TB24: 30.0000 mg | ORAL_TABLET | Freq: Every day | ORAL | 0 refills | Status: DC

## 2019-06-15 MED FILL — NIFEDIPINE ER OSMOTIC RELEA: 30 | 30 days supply | Qty: 30 | Fill #0

## 2019-06-15 NOTE — Telephone Encounter (Signed)
Sent to PCP. Alicia Spears, CMA  

## 2019-06-15 NOTE — Progress Notes (Signed)
Patient ID: Alicia Spears, female   DOB: 1986/06/23, 32 y.o.   MRN: [id number]   Phone message received the patient needs refill of nifedipine.  Patient was last seen in the office in January of this year.  30-day supply will be sent to pharmacy and patient needs to keep her upcoming appointment later this month.

## 2019-06-15 NOTE — Telephone Encounter (Signed)
Please notify patient that [facility]  Please keep upcoming office visit

## 2019-06-16 NOTE — Telephone Encounter (Signed)
Left voicemail notifying patient that 30 day supply was sent to [facility]. Keep appointment with [facility] on 9/16. Return call to [facility] at [phone] with any questions or concerns. Nat Christen, CMA

## 2019-06-20 ENCOUNTER — Telehealth (INDEPENDENT_AMBULATORY_CARE_PROVIDER_SITE_OTHER): Payer: Self-pay

## 2019-06-20 NOTE — Telephone Encounter (Signed)
Patient called wanting to inform that she will no longer be coming to our Clinic. Stating that she did not like the way he refill was handled. Patient states that she should not been told that she needed to keep her appointment on 06-22-19 for future refills. Patient also stated she did not pick up her refill for NIFEdipine (PROCARDIA-XL/NIFEDICAL-XL) 30 MG 24 hr tablet.  Thank you Whitney Post

## 2019-06-21 NOTE — Telephone Encounter (Signed)
Noted. PCP is aware. Nat Christen, CMA

## 2019-06-22 ENCOUNTER — Ambulatory Visit (INDEPENDENT_AMBULATORY_CARE_PROVIDER_SITE_OTHER): Payer: Medicaid Other | Admitting: Primary Care

## 2019-08-25 ENCOUNTER — Other Ambulatory Visit: Payer: Self-pay | Admitting: Physician Assistant

## 2019-08-25 DIAGNOSIS — N6452 Nipple discharge: Secondary | ICD-10-CM

## 2019-08-29 ENCOUNTER — Other Ambulatory Visit: Payer: Self-pay | Admitting: Physician Assistant

## 2019-08-31 ENCOUNTER — Other Ambulatory Visit: Payer: Self-pay

## 2019-08-31 ENCOUNTER — Ambulatory Visit
Admission: RE | Admit: 2019-08-31 | Discharge: 2019-08-31 | Disposition: A | Discharge status: Home / Self Care | Payer: Medicaid Other | Source: Ambulatory Visit | Attending: Physician Assistant | Admitting: Physician Assistant

## 2019-08-31 DIAGNOSIS — N6452 Nipple discharge: Secondary | ICD-10-CM

## 2020-02-01 IMAGING — MG DIGITAL DIAGNOSTIC BILAT W/ TOMO W/ CAD
6 of 10 series · 6 of 30 positions shown · non-contrast
Comparison: None.

CLINICAL DATA: 33-year-old female presenting for evaluation of 1
recent episode of spontaneous green left nipple discharge with pain
and tenderness.She states that this may also have occurred once
earlier in the year. She has not seen the discharge come out of the
nipple, but noticed wetness and crusting on her clothing.

EXAM:
DIGITAL DIAGNOSTIC BILATERAL MAMMOGRAM WITH CAD AND TOMO
ULTRASOUND LEFT BREAST

[L CC synth-2D]
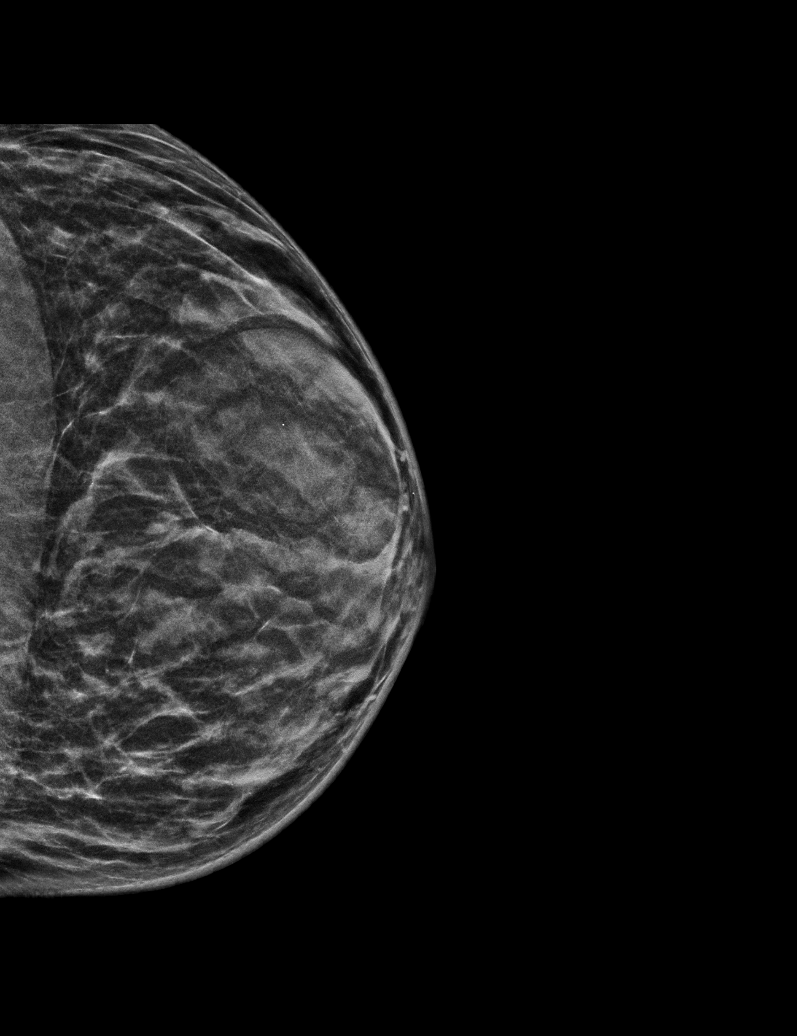

[L MLO synth-2D (1 of 2)]
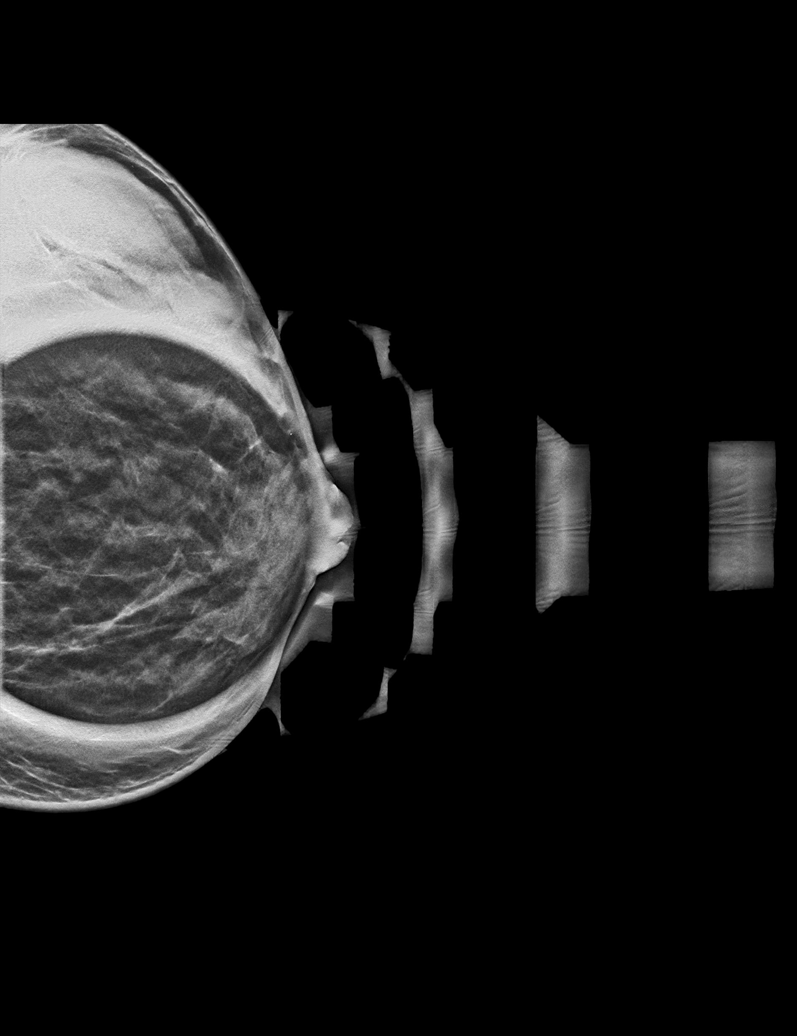

[R MLO synth-2D]
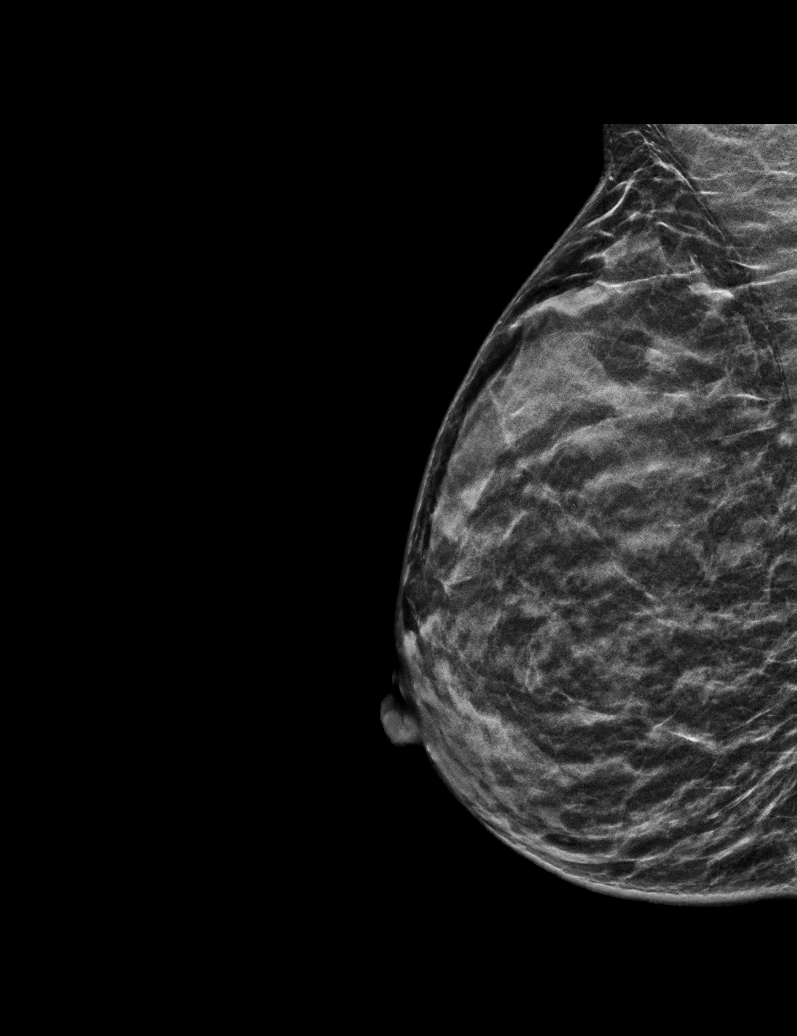

[R CC synth-2D]
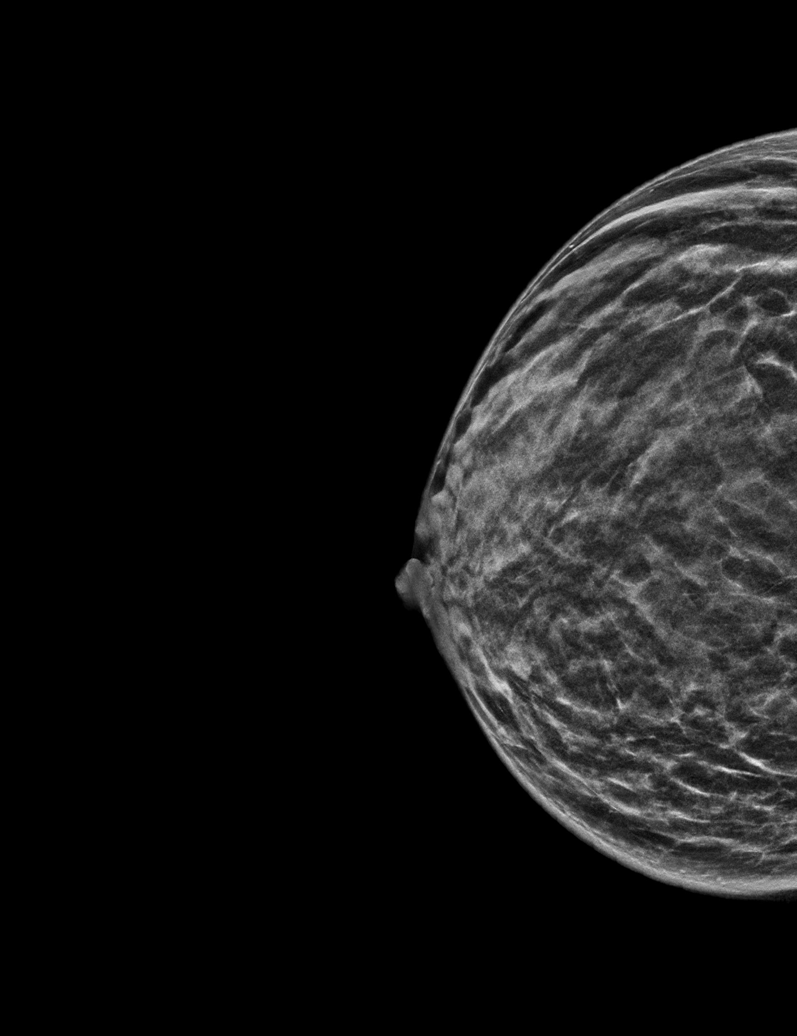

[L MLO synth-2D (2 of 2)]
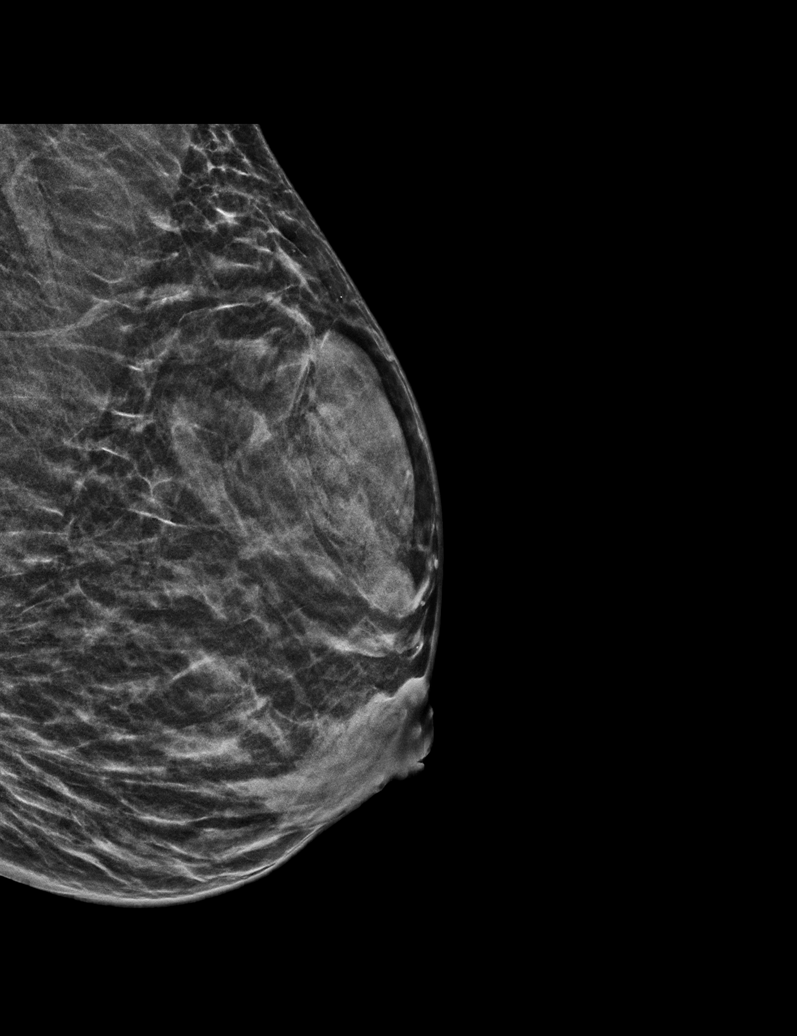

[L CC tomo · tomo slice 19/38.0]
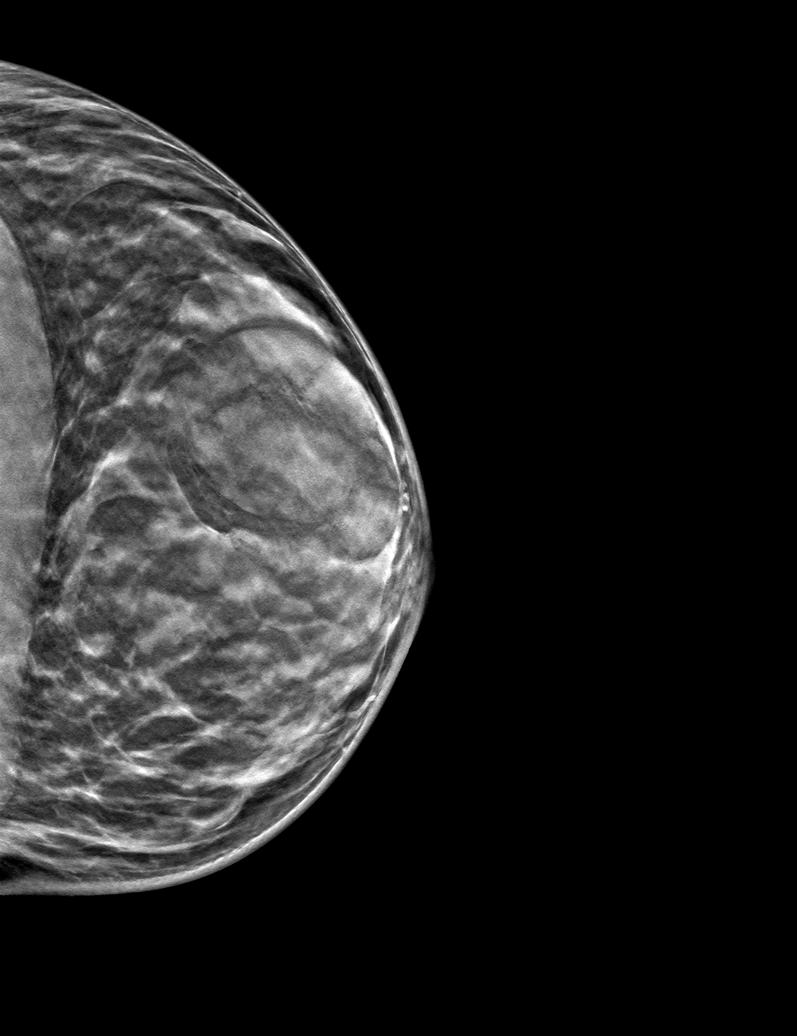

[6 of 30 positions shown; findings below may reference images not displayed]

ACR Breast Density Category c: The breast tissue is heterogeneously
dense, which may obscure small masses.
FINDINGS: On the MLO view, there is a dense asymmetry in the retroareolar left
breast. This is similar to the right, but more concentrated in
density. In the upper-outer quadrant of the left breast there is a
circumscribed oval mass measuring 5.8 cm, with both fat and soft
tissue density consistent with a benign hamartoma. No suspicious
calcifications, masses or areas of distortion are seen in the
bilateral breasts.

Mammographic images were processed with CAD.

Physical exam of the left breast demonstrates a nodular irregular
shape to the left nipple, with some indentation of the inferior
aspect of the nipple. She has multiple prominent [REDACTED]'s glands
on the areola bilaterally. No rashes are noted on the left nipple. A
small amount of discharge can be elicited with manual expression,
however it is such as small amount that I cannot assess color. It
appears to be expressed from a single duct.

Ultrasound of the retroareolar left breast demonstrates no
suspicious masses or areas of shadowing.
IMPRESSION: 1. No definite mammographic or targeted sonographic findings to
explain the patient's left nipple discharge.

2. There is a benign 5.8 cm hamartoma in the upper-outer left
breast.

3.  No mammographic evidence of malignancy in the bilateral breasts.

RECOMMENDATION:
Bilateral breast MRI is recommended for further evaluation of the
left nipple discharge.

I have discussed the findings and recommendations with the patient.
If applicable, a reminder letter will be sent to the patient
regarding the next appointment.

BI-RADS CATEGORY  2: Benign.

## 2020-02-01 IMAGING — US US BREAST*L* LIMITED INC AXILLA
1 series · 3 of 3 positions shown · non-contrast
Comparison: None.

CLINICAL DATA: 33-year-old female presenting for evaluation of 1
recent episode of spontaneous green left nipple discharge with pain
and tenderness.She states that this may also have occurred once
earlier in the year. She has not seen the discharge come out of the
nipple, but noticed wetness and crusting on her clothing.

EXAM:
DIGITAL DIAGNOSTIC BILATERAL MAMMOGRAM WITH CAD AND TOMO
ULTRASOUND LEFT BREAST

[Series 1: us breast*left* limited inc axilla · 0.06mm/px · 3 of 3 slices shown]
[im 1/3]
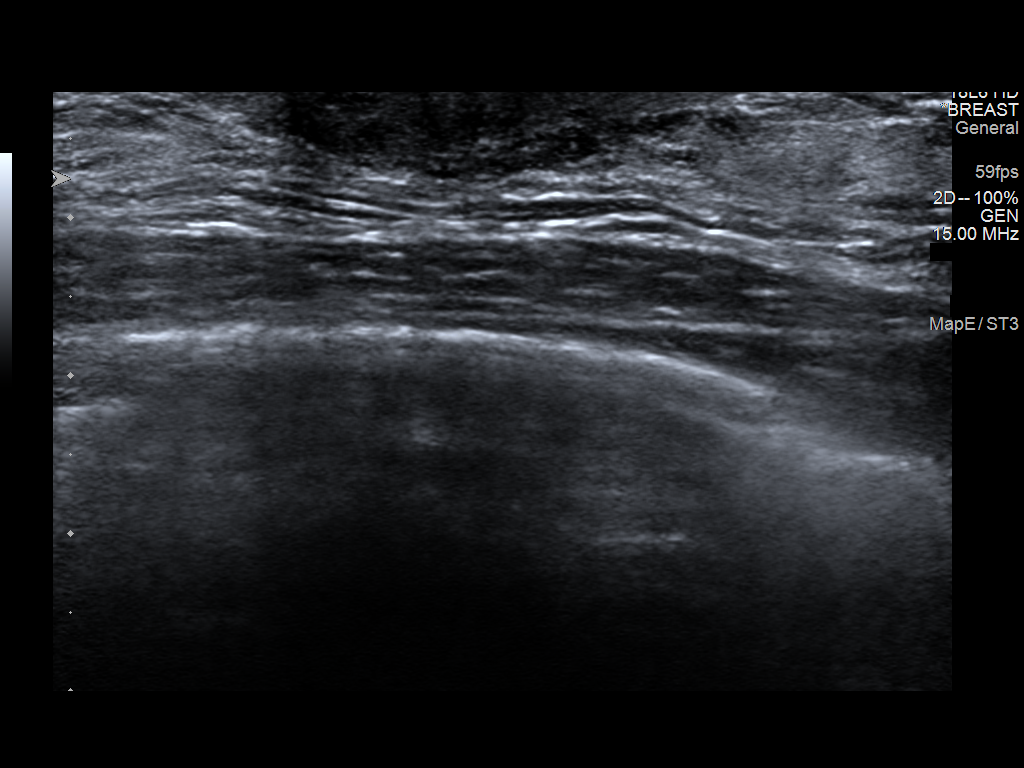
[im 2/3]
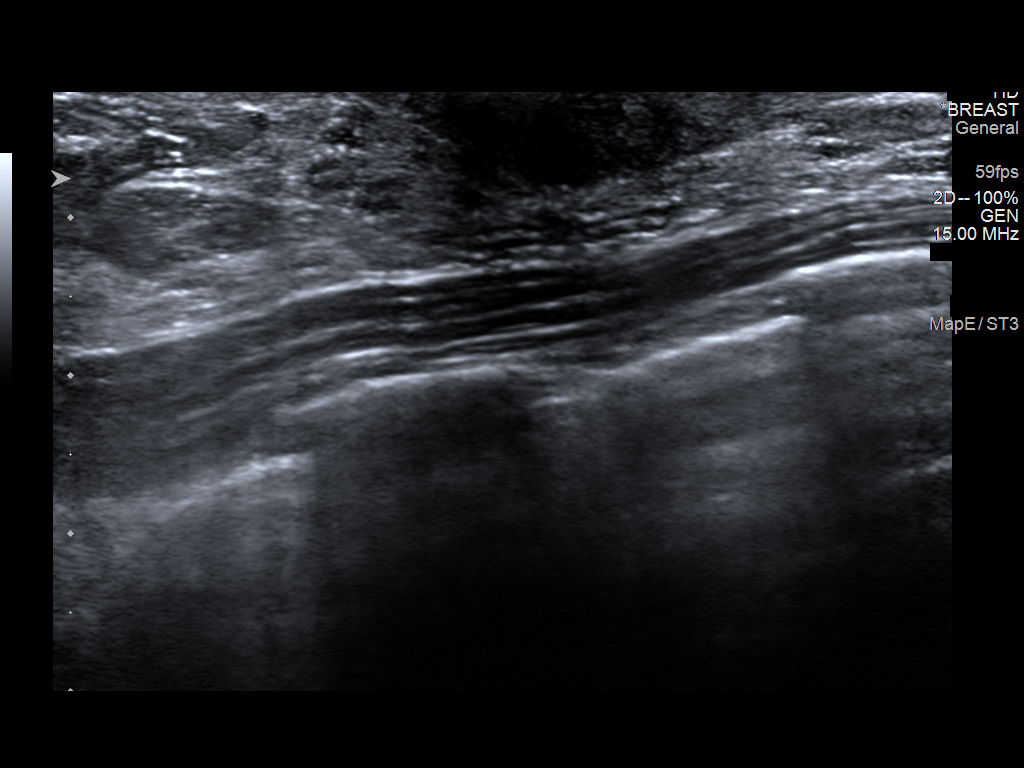
[im 3/3]
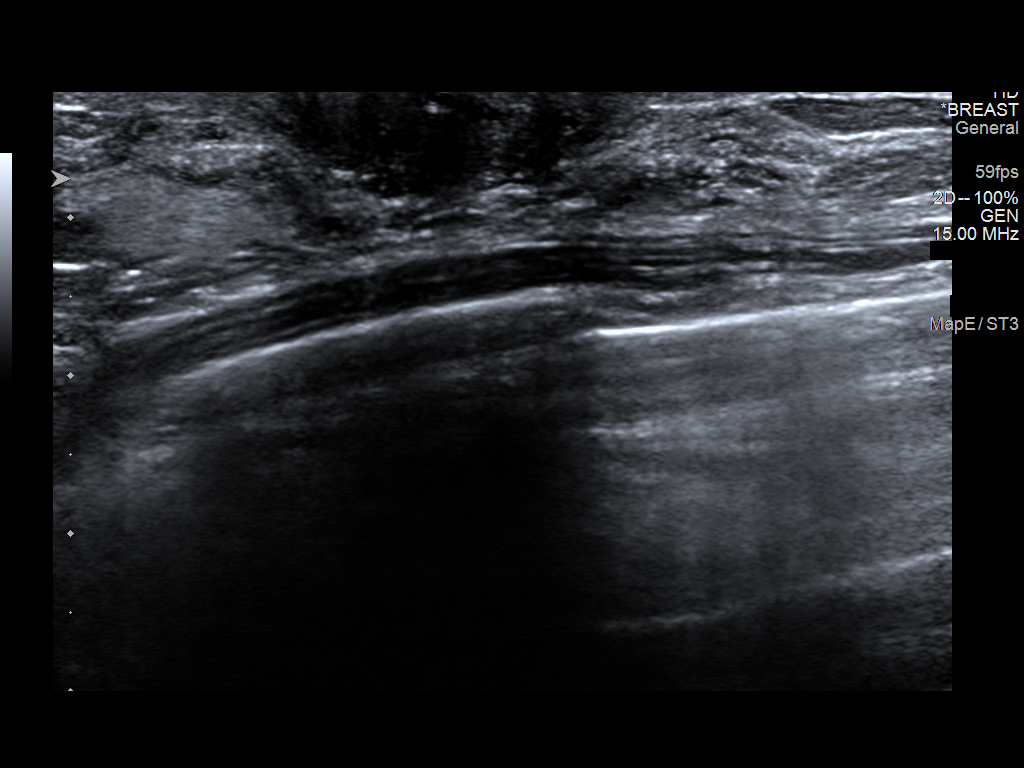

[3 of 3 positions shown; findings below may reference images not displayed]

ACR Breast Density Category c: The breast tissue is heterogeneously
dense, which may obscure small masses.
FINDINGS: On the MLO view, there is a dense asymmetry in the retroareolar left
breast. This is similar to the right, but more concentrated in
density. In the upper-outer quadrant of the left breast there is a
circumscribed oval mass measuring 5.8 cm, with both fat and soft
tissue density consistent with a benign hamartoma. No suspicious
calcifications, masses or areas of distortion are seen in the
bilateral breasts.

Mammographic images were processed with CAD.

Physical exam of the left breast demonstrates a nodular irregular
shape to the left nipple, with some indentation of the inferior
aspect of the nipple. She has multiple prominent [REDACTED]'s glands
on the areola bilaterally. No rashes are noted on the left nipple. A
small amount of discharge can be elicited with manual expression,
however it is such as small amount that I cannot assess color. It
appears to be expressed from a single duct.

Ultrasound of the retroareolar left breast demonstrates no
suspicious masses or areas of shadowing.
IMPRESSION: 1. No definite mammographic or targeted sonographic findings to
explain the patient's left nipple discharge.

2. There is a benign 5.8 cm hamartoma in the upper-outer left
breast.

3.  No mammographic evidence of malignancy in the bilateral breasts.

RECOMMENDATION:
Bilateral breast MRI is recommended for further evaluation of the
left nipple discharge.

I have discussed the findings and recommendations with the patient.
If applicable, a reminder letter will be sent to the patient
regarding the next appointment.

BI-RADS CATEGORY  2: Benign.

## 2020-12-19 ENCOUNTER — Ambulatory Visit
Admission: RE | Admit: 2020-12-19 | Discharge: 2020-12-19 | Disposition: A | Discharge status: Home / Self Care | Payer: Medicaid Other | Source: Ambulatory Visit | Attending: Family Medicine | Admitting: Family Medicine

## 2020-12-19 ENCOUNTER — Other Ambulatory Visit: Payer: Self-pay

## 2020-12-19 VITALS — BP 144/79 | HR 81 | Temp 98.3°F | Resp 16

## 2020-12-19 DIAGNOSIS — S61217A Laceration without foreign body of left little finger without damage to nail, initial encounter: Secondary | ICD-10-CM | POA: Diagnosis not present

## 2020-12-19 DIAGNOSIS — Z23 Encounter for immunization: Secondary | ICD-10-CM

## 2020-12-19 MED ORDER — TETANUS-DIPHTH-ACELL PERTUSSIS 5-2.5-18.5 LF-MCG/0.5 IM SUSY
0.5000 mL | PREFILLED_SYRINGE | Freq: Once | INTRAMUSCULAR | Status: AC
  Administered 2020-12-19: 0.5 mL via INTRAMUSCULAR

## 2020-12-19 NOTE — ED Triage Notes (Signed)
Patient presents to Urgent Care with complaints of a left pinky laceration from a knife she was cleaning yesterday. She is unsure of last Tdap vaccine. She has been cleansing area with soap/water and applying Vaseline to cut and securing with a Band-Aid.

## 2020-12-19 NOTE — ED Provider Notes (Signed)
[facility]    CSN: [id number] Arrival date & time: 12/19/20  1347      History   Chief Complaint Chief Complaint  Patient presents with  . Appointment    1400 appt for finger laceration     HPI Alicia Spears is a 35 y.o. female.   Patient presenting today with a left pinky laceration that she sustained last night while cleaning a kitchen knife.  She states since the incident she has been applying pressure, cleaning with soap and water, using Vaseline and keeping covered.  The bleeding is well controlled and her pain is fairly well controlled.  She denies numbness, tingling, decreased movement to the finger, swelling.  Last Tdap was in 2012.     Past Medical History:  Diagnosis Date  . Fibroids   . Herpes simplex virus (HSV) infection   . Hypertension in pregnancy, preeclampsia     Patient Active Problem List   Diagnosis Date Noted  . Hypertension during pregnancy 10/26/2018  . Essential hypertension 10/26/2018    Past Surgical History:  Procedure Laterality Date  . NO PAST SURGERIES      OB History    Gravida  2   Para  2   Term  2   Preterm      AB      Living  2     SAB      IAB      Ectopic      Multiple      Live Births  2            Home Medications    Prior to Admission medications   Medication Sig Start Date End Date Taking? Authorizing Provider  hydrochlorothiazide (HYDRODIURIL) 12.5 MG tablet Take 1 tablet (12.5 mg total) by mouth daily. 10/26/18   Fulp, Cammie, MD  losartan (COZAAR) 25 MG tablet Take 1 tablet (25 mg total) by mouth daily. 10/26/18   Fulp, Cammie, MD  NIFEdipine (PROCARDIA-XL/NIFEDICAL-XL) 30 MG 24 hr tablet Take 1 tablet (30 mg total) by mouth at bedtime. Office visit needed 06/15/19   Antony Blackbird, MD    Family History Family History  Problem Relation Age of Onset  . Hypertension Mother   . CVA Mother   . Colon cancer Paternal Grandfather     Social History Social History   Tobacco Use   . Smoking status: Current Every Day Smoker    Packs/day: 0.50  . Smokeless tobacco: Never Used  Vaping Use  . Vaping Use: Former  . Substances: Nicotine, Flavoring  Substance Use Topics  . Alcohol use: Yes    Comment: occasionally  . Drug use: Not Currently    Types: Marijuana     Allergies   Patient has no known allergies.   Review of Systems Review of Systems Per HPI  Physical Exam Triage Vital Signs ED Triage Vitals  Enc Vitals Group     BP 12/19/20 1359 (!) 144/79     Pulse Rate 12/19/20 1359 81     Resp 12/19/20 1359 16     Temp 12/19/20 1359 98.3 F (36.8 C)     Temp Source 12/19/20 1359 Oral     SpO2 12/19/20 1359 99 %     Weight --      Height --      Head Circumference --      Peak Flow --      Pain Score 12/19/20 1357 2     Pain Loc --  Pain Edu? --      Excl. in [place]? --    No data found.  Updated Vital Signs BP (!) 144/79 (BP Location: Left Arm)   Pulse 81   Temp 98.3 F (36.8 C) (Oral)   Resp 16   LMP 12/10/2020   SpO2 99%   Visual Acuity Right Eye Distance:   Left Eye Distance:   Bilateral Distance:    Right Eye Near:   Left Eye Near:    Bilateral Near:     Physical Exam Vitals and nursing note reviewed.  Constitutional:      Appearance: Normal appearance. She is not ill-appearing.  HENT:     Head: Atraumatic.  Eyes:     Extraocular Movements: Extraocular movements intact.     Conjunctiva/sclera: Conjunctivae normal.  Cardiovascular:     Rate and Rhythm: Normal rate and regular rhythm.     Heart sounds: Normal heart sounds.  Pulmonary:     Effort: Pulmonary effort is normal.     Breath sounds: Normal breath sounds.  Musculoskeletal:        General: Normal range of motion.     Cervical back: Normal range of motion and neck supple.  Skin:    General: Skin is warm and dry.     Comments: 1-1.5 cm laceration distal pad of left fifth finger, wound edges dry, no active bleeding.  Good movement of the finger  Neurological:      Mental Status: She is alert and oriented to person, place, and time.     Comments: Left upper extremity neurovascularly intact  Psychiatric:        Mood and Affect: Mood normal.        Thought Content: Thought content normal.        Judgment: Judgment normal.      UC Treatments / Results  Labs (all labs ordered are listed, but only abnormal results are displayed) Labs Reviewed - No data to display  EKG   Radiology No results found.  Procedures Laceration Repair  Date/Time: 12/19/2020 3:01 PM Performed by: Volney American, PA-C Authorized by: Volney American, PA-C   Consent:    Consent obtained:  Verbal   Consent given by:  Patient   Risks, benefits, and alternatives were discussed: yes     Risks discussed:  Infection, pain and need for additional repair   Alternatives discussed:  Observation Universal protocol:    Procedure explained and questions answered to patient or proxy's satisfaction: yes     Relevant documents present and verified: yes     Site/side marked: yes     Immediately prior to procedure, a time out was called: yes     Patient identity confirmed:  Verbally with patient and arm band Anesthesia:    Anesthesia method:  None Laceration details:    Location:  Finger   Finger location:  L small finger   Length (cm):  1   Depth (mm):  2 Pre-procedure details:    Preparation:  Patient was prepped and draped in usual sterile fashion Exploration:    Limited defect created (wound extended): no     Hemostasis achieved with:  Direct pressure   Wound exploration: wound explored through full range of motion and entire depth of wound visualized     Wound extent comment:  Subcutaneous   Contaminated: no   Treatment:    Area cleansed with:  Saline   Amount of cleaning:  Standard   Irrigation solution:  Sterile saline  Irrigation method:  Pressure wash   Debridement:  None   Undermining:  None   Scar revision: no     Layers repaired:  Dermal. Skin repair:    Repair method:  Tissue adhesive and Steri-Strips Approximation:    Approximation:  Close Repair type:    Repair type:  Simple Post-procedure details:    Dressing:  Bulky dressing   Procedure completion:  Tolerated well, no immediate complications   (including critical care time)  Medications Ordered in UC Medications  Tdap (BOOSTRIX) injection 0.5 mL (0.5 mLs Intramuscular Given 12/19/20 1434)    Initial Impression / Assessment and Plan / UC Course  I have reviewed the triage vital signs and the nursing notes.  Pertinent labs & imaging results that were available during my care of the patient were reviewed by me and considered in my medical decision making (see chart for details).     Tdap updated today as it is due this year, wound irrigated and closed with Dermabond and Steri-Strips.  Wrapped in soft gauze wrapped for protection.  Discussed home wound care, signs of infection to watch for.  Follow-up if worsening or not resolving.  Note given.  Final Clinical Impressions(s) / UC Diagnoses   Final diagnoses:  Laceration of left little finger without foreign body without damage to nail, initial encounter   Discharge Instructions   None    ED Prescriptions    None     PDMP not reviewed this encounter.   Volney American, [place] 12/19/20 1503

## 2021-02-25 ENCOUNTER — Encounter: Payer: Self-pay | Admitting: General Practice

## 2021-03-22 ENCOUNTER — Ambulatory Visit: Payer: Medicaid Other | Admitting: Obstetrics & Gynecology

## 2021-08-09 ENCOUNTER — Other Ambulatory Visit: Payer: Self-pay

## 2021-08-09 ENCOUNTER — Ambulatory Visit
Admission: EM | Admit: 2021-08-09 | Discharge: 2021-08-09 | Disposition: A | Discharge status: Home / Self Care | Payer: Medicaid Other

## 2021-08-09 DIAGNOSIS — R6889 Other general symptoms and signs: Secondary | ICD-10-CM

## 2021-08-09 DIAGNOSIS — Z20828 Contact with and (suspected) exposure to other viral communicable diseases: Secondary | ICD-10-CM | POA: Diagnosis not present

## 2021-08-09 DIAGNOSIS — R0981 Nasal congestion: Secondary | ICD-10-CM | POA: Diagnosis not present

## 2021-08-09 LAB — POCT INFLUENZA A/B
Influenza A, POC: NEGATIVE
Influenza B, POC: NEGATIVE

## 2021-08-09 MED ORDER — OSELTAMIVIR PHOSPHATE 75 MG PO CAPS: 75.0000 mg | ORAL_CAPSULE | Freq: Two times a day (BID) | ORAL | 0 refills | Status: DC

## 2021-08-09 NOTE — ED Provider Notes (Signed)
[facility]    CSN: [id number] Arrival date & time: 08/09/21  1053      History   Chief Complaint Chief Complaint  Patient presents with   Nasal Congestion    HPI Alicia Spears is a 35 y.o. female.   Patient here today for evaluation of nasal congestion body aches that started 2 days ago.  She reports that 2 of her coworkers have tested positive for flu.  She denies any fever.  She has not had any nausea, vomiting or diarrhea.  She has not been any treatment for symptoms.  The history is provided by the patient.   Past Medical History:  Diagnosis Date   Fibroids    Herpes simplex virus (HSV) infection    Hypertension in pregnancy, preeclampsia     Patient Active Problem List   Diagnosis Date Noted   Hypertension during pregnancy 10/26/2018   Essential hypertension 10/26/2018    Past Surgical History:  Procedure Laterality Date   NO PAST SURGERIES      OB History     Gravida  2   Para  2   Term  2   Preterm      AB      Living  2      SAB      IAB      Ectopic      Multiple      Live Births  2            Home Medications    Prior to Admission medications   Medication Sig Start Date End Date Taking? Authorizing Provider  oseltamivir (TAMIFLU) 75 MG capsule Take 1 capsule (75 mg total) by mouth every 12 (twelve) hours. 08/09/21  Yes Francene Finders, PA-C  hydrochlorothiazide (HYDRODIURIL) 12.5 MG tablet Take 1 tablet (12.5 mg total) by mouth daily. 10/26/18   Fulp, Cammie, MD  losartan (COZAAR) 25 MG tablet Take 1 tablet (25 mg total) by mouth daily. 10/26/18   Fulp, Cammie, MD  NIFEdipine (PROCARDIA-XL/NIFEDICAL-XL) 30 MG 24 hr tablet Take 1 tablet (30 mg total) by mouth at bedtime. Office visit needed 06/15/19   Fulp, Cammie, MD  valACYclovir (VALTREX) 500 MG tablet Take 1 tablet by mouth every 12 (twelve) hours. 02/19/21   [provider]    Family History Family History  Problem Relation Age of Onset    Hypertension Mother    CVA Mother    Colon cancer Paternal Grandfather     Social History Social History   Tobacco Use   Smoking status: Every Day    Packs/day: 0.50    Types: Cigarettes   Smokeless tobacco: Never  Vaping Use   Vaping Use: Former   Substances: Nicotine, Flavoring  Substance Use Topics   Alcohol use: Yes    Comment: occasionally   Drug use: Not Currently    Types: Marijuana     Allergies   Patient has no known allergies.   Review of Systems Review of Systems  Constitutional:  Negative for chills and fever.  HENT:  Positive for congestion and sinus pressure. Negative for ear pain and sore throat.   Eyes:  Negative for discharge and redness.  Respiratory:  Negative for cough, shortness of breath and wheezing.   Gastrointestinal:  Negative for abdominal pain, diarrhea, nausea and vomiting.    Physical Exam Triage Vital Signs ED Triage Vitals  Enc Vitals Group     BP 08/09/21 1153 (!) 142/83     Pulse Rate  08/09/21 1153 (!) 103     Resp 08/09/21 1153 18     Temp 08/09/21 1153 98.2 F (36.8 C)     Temp Source 08/09/21 1153 Oral     SpO2 08/09/21 1153 100 %     Weight --      Height --      Head Circumference --      Peak Flow --      Pain Score 08/09/21 1154 0     Pain Loc --      Pain Edu? --      Excl. in [place]? --    No data found.  Updated Vital Signs BP (!) 142/83 (BP Location: Left Arm)   Pulse (!) 103   Temp 98.2 F (36.8 C) (Oral)   Resp 18   SpO2 100%     Physical Exam Vitals and nursing note reviewed.  Constitutional:      General: She is not in acute distress.    Appearance: Normal appearance. She is not ill-appearing.  HENT:     Head: Normocephalic and atraumatic.     Nose: Congestion present.  Eyes:     Conjunctiva/sclera: Conjunctivae normal.  Cardiovascular:     Rate and Rhythm: Normal rate and regular rhythm.     Heart sounds: Normal heart sounds. No murmur heard. Pulmonary:     Effort: Pulmonary effort is  normal. No respiratory distress.     Breath sounds: Normal breath sounds. No wheezing, rhonchi or rales.  Skin:    General: Skin is warm and dry.  Neurological:     Mental Status: She is alert.  Psychiatric:        Mood and Affect: Mood normal.        Thought Content: Thought content normal.     UC Treatments / Results  Labs (all labs ordered are listed, but only abnormal results are displayed) Labs Reviewed  POCT INFLUENZA A/B    EKG   Radiology No results found.  Procedures Procedures (including critical care time)  Medications Ordered in UC Medications - No data to display  Initial Impression / Assessment and Plan / UC Course  I have reviewed the triage vital signs and the nursing notes.  Pertinent labs & imaging results that were available during my care of the patient were reviewed by me and considered in my medical decision making (see chart for details).  Flu test negative in office however given known exposure will treat with Tamiflu.  Recommended follow-up if symptoms fail to improve or worsen anyway.  Final Clinical Impressions(s) / UC Diagnoses   Final diagnoses:  Exposure to influenza  Flu-like symptoms   Discharge Instructions   None    ED Prescriptions     Medication Sig Dispense Auth. Provider   oseltamivir (TAMIFLU) 75 MG capsule Take 1 capsule (75 mg total) by mouth every 12 (twelve) hours. 10 capsule Francene Finders, PA-C      PDMP not reviewed this encounter.   Francene Finders, PA-C 08/09/21 1320

## 2021-08-09 NOTE — ED Triage Notes (Signed)
Pt c/o nasal congestion with drainage, body aches.   Denies sore throat, cough, headache, fever, earache.  Onset Wednesday.   Contact with flu (2 coworkers)

## 2022-08-07 ENCOUNTER — Ambulatory Visit: Payer: Self-pay

## 2024-02-10 ENCOUNTER — Encounter: Payer: Self-pay | Admitting: Sports Medicine

## 2024-02-10 ENCOUNTER — Ambulatory Visit: Admitting: Sports Medicine

## 2024-02-10 VITALS — BP 132/86 | HR 86 | Temp 98.0°F | Ht 65.75 in | Wt 160.4 lb

## 2024-02-10 DIAGNOSIS — Z8759 Personal history of other complications of pregnancy, childbirth and the puerperium: Secondary | ICD-10-CM | POA: Diagnosis not present

## 2024-02-10 DIAGNOSIS — D259 Leiomyoma of uterus, unspecified: Secondary | ICD-10-CM

## 2024-02-10 DIAGNOSIS — Z7689 Persons encountering health services in other specified circumstances: Secondary | ICD-10-CM | POA: Diagnosis not present

## 2024-02-10 DIAGNOSIS — I10 Essential (primary) hypertension: Secondary | ICD-10-CM

## 2024-02-10 DIAGNOSIS — F17200 Nicotine dependence, unspecified, uncomplicated: Secondary | ICD-10-CM

## 2024-02-10 DIAGNOSIS — Z6826 Body mass index (BMI) 26.0-26.9, adult: Secondary | ICD-10-CM | POA: Diagnosis not present

## 2024-02-10 DIAGNOSIS — Z833 Family history of diabetes mellitus: Secondary | ICD-10-CM

## 2024-02-10 MED ORDER — BUPROPION HCL ER (SR) 150 MG PO TB12: 150.0000 mg | ORAL_TABLET | Freq: Two times a day (BID) | ORAL | 0 refills | Status: AC

## 2024-02-10 NOTE — Patient Instructions (Addendum)
 Extended-release (SR) tablet) 150 mg orally once daily in the morning for 3 days, then increase to 150 mg orally twice daily,

## 2024-02-10 NOTE — Progress Notes (Signed)
 Careteam: Patient Care Team: [facility] as PCP - General  PLACE OF SERVICE:  [facility]  Advanced Directive information    No Known Allergies  Chief Complaint  Patient presents with   New Patient (Initial Visit)    New patient , est care , discuss b/p  she was taking b/p medication  she told that her b/p had improved and she was taken  off medication, she has been off 2 years ago. She has been checking notice that has increased she said ranging in 140 on the top     Discussed the use of AI scribe software for clinical note transcription with the patient, who gave verbal consent to proceed.  History of Present Illness  Alicia Spears is a 38 year old female who presents for a routine check-up.  She has a history of hypertension, initially noted during her pregnancy with preeclampsia. Her blood pressure remained elevated postpartum but has since improved without medication. Her last recorded blood pressure was 132/86 mmHg.   She experiences headaches, which she attributes to work-related stress following a promotion about a year ago. These headaches occur when she is contacted by work on her days off. No associated dizziness or lightheadedness.  She reports heartburn, particularly after consuming foods like spaghetti and lasagna, and finds relief by drinking a small amount of Pepsi.  She has a history of genital herpes with previous flare-ups . She uses an over-the-counter product for management.  She has uterine fibroids causing heavy menstrual bleeding, especially after increased physical activity. A planned hysterectomy in 2020 was postponed due to the COVID-19 pandemic.  She smokes approximately eight cigarettes a day and wants to quit, influenced by her daughter's concerns and her grandfather's history of smoking-related illness.  She reports a bruise on her leg from bumping into a desk at work, which is not causing significant pain.   H/o uterine  fibroids Intermittent breast  pain    Review of Systems:  Review of Systems  Constitutional:  Negative for chills and fever.  HENT:  Negative for congestion and sore throat.   Eyes:  Negative for double vision.  Respiratory:  Negative for cough, sputum production and shortness of breath.   Cardiovascular:  Negative for chest pain, palpitations and leg swelling.  Gastrointestinal:  Negative for abdominal pain, heartburn and nausea.  Genitourinary:  Negative for dysuria, frequency and hematuria.  Musculoskeletal:  Negative for falls and myalgias.  Neurological:  Negative for dizziness, sensory change and focal weakness.   Negative unless indicated in HPI.   Past Medical History:  Diagnosis Date   Fibroids    Herpes simplex virus (HSV) infection    Hypertension in pregnancy, preeclampsia    Past Surgical History:  Procedure Laterality Date   NO PAST SURGERIES     Social History:   reports that she has been smoking. She has never used smokeless tobacco. She reports current alcohol use. She reports that she does not currently use drugs after having used the following drugs: Marijuana.  Family History  Problem Relation Age of Onset   Hypertension Mother    CVA Mother    Colon cancer Paternal Grandfather     Medications: Patient's Medications  New Prescriptions   BUPROPION (WELLBUTRIN SR) 150 MG 12 HR TABLET    Take 1 tablet (150 mg total) by mouth 2 (two) times daily. Extended-release (SR) tablet) 150 mg orally once daily in the morning for 3 days, then increase to 150 mg orally twice  daily,  Previous Medications   VALACYCLOVIR  (VALTREX ) 500 MG TABLET    Take 1 tablet by mouth every 12 (twelve) hours.  Modified Medications   No medications on file  Discontinued Medications   HYDROCHLOROTHIAZIDE  (HYDRODIURIL ) 12.5 MG TABLET    Take 1 tablet (12.5 mg total) by mouth daily.   LOSARTAN  (COZAAR ) 25 MG TABLET    Take 1 tablet (25 mg total) by mouth daily.   NIFEDIPINE   (PROCARDIA -XL/NIFEDICAL-XL) 30 MG 24 HR TABLET    Take 1 tablet (30 mg total) by mouth at bedtime. Office visit needed   OSELTAMIVIR  (TAMIFLU ) 75 MG CAPSULE    Take 1 capsule (75 mg total) by mouth every 12 (twelve) hours.    Physical Exam: Vitals:   02/10/24 0906  BP: 132/86  Pulse: 86  Temp: 98 F (36.7 C)  TempSrc: Temporal  SpO2: 99%  Weight: 160 lb 6.4 oz (72.8 kg)  Height: 5' 5.75" (1.67 m)   Body mass index is 26.09 kg/m. BP Readings from Last 3 Encounters:  02/10/24 132/86  08/09/21 (!) 142/83  12/19/20 (!) 144/79   Wt Readings from Last 3 Encounters:  02/10/24 160 lb 6.4 oz (72.8 kg)  11/04/18 127 lb 1.6 oz (57.7 kg)  10/26/18 131 lb 12.8 oz (59.8 kg)    Physical Exam Constitutional:      Appearance: Normal appearance.  HENT:     Head: Normocephalic and atraumatic.  Cardiovascular:     Rate and Rhythm: Normal rate and regular rhythm.  Pulmonary:     Effort: Pulmonary effort is normal. No respiratory distress.     Breath sounds: Normal breath sounds. No wheezing.  Abdominal:     General: Bowel sounds are normal. There is no distension.     Tenderness: There is no abdominal tenderness. There is no guarding or rebound.     Comments:    Musculoskeletal:        General: No swelling.  Neurological:     Mental Status: She is alert. Mental status is at baseline.     Motor: No weakness.     Labs reviewed: Basic Metabolic Panel: No results for input(s): "NA", "K", "CL", "CO2", "GLUCOSE", "BUN", "CREATININE", "CALCIUM", "MG", "PHOS", "TSH" in the last 8760 hours. Liver Function Tests: No results for input(s): "AST", "ALT", "ALKPHOS", "BILITOT", "PROT", "ALBUMIN" in the last 8760 hours. No results for input(s): "LIPASE", "AMYLASE" in the last 8760 hours. No results for input(s): "AMMONIA" in the last 8760 hours. CBC: No results for input(s): "WBC", "NEUTROABS", "HGB", "HCT", "MCV", "PLT" in the last 8760 hours. Lipid Panel: No results for input(s): "CHOL",  "HDL", "LDLCALC", "TRIG", "CHOLHDL", "LDLDIRECT" in the last 8760 hours. TSH: No results for input(s): "TSH" in the last 8760 hours. A1C: Lab Results  Component Value Date   HGBA1C 5.7 (A) 10/26/2018    Assessment and Plan Assessment & Plan   1. Primary hypertension (Primary) 2. H/O eclampsia  Pt no longer on bp meds Bp today 132/86  Instructed patient to exercise regularly  Avoid salty foods Complete Metabolic Panel with eGFR - CBC (no diff)  3. BMI 26.0-26.9,adult  Will check lipid panel  4. Encounter to establish care  Complete Metabolic Panel with eGFR - Lipid Panel - CBC (no diff)  5. Family history of diabetes mellitus  Will check a1c  6. Smoker  Counseled on smoking cessation - buPROPion (WELLBUTRIN SR) 150 MG 12 hr tablet; Take 1 tablet (150 mg total) by mouth 2 (two) times daily. Extended-release (SR) tablet)  150 mg orally once daily in the morning for 3 days, then increase to 150 mg orally twice daily,  Dispense: 180 tablet; Refill: 0  7. Uterine leiomyoma, unspecified location   - Ambulatory referral to Gynecology - CBC (no diff)

## 2024-02-11 LAB — COMPLETE METABOLIC PANEL WITHOUT GFR
AG Ratio: 1.5 (calc) (ref 1.0–2.5)
ALT: 11 U/L (ref 6–29)
AST: 14 U/L (ref 10–30)
Albumin: 4.6 g/dL (ref 3.6–5.1)
Alkaline phosphatase (APISO): 40 U/L (ref 31–125)
BUN/Creatinine Ratio: 8 (calc) (ref 6–22)
BUN: 6 mg/dL — ABNORMAL LOW (ref 7–25)
CO2: 26 mmol/L (ref 20–32)
Calcium: 9.6 mg/dL (ref 8.6–10.2)
Chloride: 107 mmol/L (ref 98–110)
Creat: 0.78 mg/dL (ref 0.50–0.97)
Globulin: 3 g/dL (ref 1.9–3.7)
Glucose, Bld: 94 mg/dL (ref 65–99)
Potassium: 4.3 mmol/L (ref 3.5–5.3)
Sodium: 138 mmol/L (ref 135–146)
Total Bilirubin: 0.6 mg/dL (ref 0.2–1.2)
Total Protein: 7.6 g/dL (ref 6.1–8.1)

## 2024-02-11 LAB — CBC
HCT: 40.9 % (ref 35.0–45.0)
Hemoglobin: 13.1 g/dL (ref 11.7–15.5)
MCH: 27.6 pg (ref 27.0–33.0)
MCHC: 32 g/dL (ref 32.0–36.0)
MCV: 86.3 fL (ref 80.0–100.0)
MPV: 9.8 fL (ref 7.5–12.5)
Platelets: 312 10*3/uL (ref 140–400)
RBC: 4.74 10*6/uL (ref 3.80–5.10)
RDW: 13.6 % (ref 11.0–15.0)
WBC: 5.7 10*3/uL (ref 3.8–10.8)

## 2024-02-11 LAB — LIPID PANEL
Cholesterol: 185 mg/dL (ref <200)
HDL: 50 mg/dL (ref >=50)
LDL Cholesterol (Calc): 115 mg/dL — ABNORMAL HIGH
Non-HDL Cholesterol (Calc): 135 mg/dL — ABNORMAL HIGH (ref <130)
Total CHOL/HDL Ratio: 3.7 (calc) (ref <5.0)
Triglycerides: 102 mg/dL (ref <150)

## 2024-02-11 LAB — HEMOGLOBIN A1C
Hgb A1c MFr Bld: 5.9 % — ABNORMAL HIGH (ref <5.7)
Mean Plasma Glucose: 123 mg/dL
eAG (mmol/L): 6.8 mmol/L

## 2024-04-12 ENCOUNTER — Ambulatory Visit: Admitting: Obstetrics and Gynecology

## 2024-05-04 ENCOUNTER — Other Ambulatory Visit: Payer: Self-pay | Admitting: Obstetrics and Gynecology

## 2024-05-04 ENCOUNTER — Ambulatory Visit: Admitting: Obstetrics and Gynecology

## 2024-05-04 ENCOUNTER — Encounter: Payer: Self-pay | Admitting: Obstetrics and Gynecology

## 2024-05-04 VITALS — BP 122/86 | HR 95 | Ht 64.76 in | Wt 159.2 lb

## 2024-05-04 DIAGNOSIS — Z7689 Persons encountering health services in other specified circumstances: Secondary | ICD-10-CM

## 2024-05-04 DIAGNOSIS — N644 Mastodynia: Secondary | ICD-10-CM

## 2024-05-04 DIAGNOSIS — D219 Benign neoplasm of connective and other soft tissue, unspecified: Secondary | ICD-10-CM | POA: Diagnosis not present

## 2024-05-04 MED ORDER — VALACYCLOVIR HCL 500 MG PO TABS: 500.0000 mg | ORAL_TABLET | Freq: Three times a day (TID) | ORAL | 6 refills | Status: AC

## 2024-05-04 NOTE — Progress Notes (Addendum)
 38 y.o. y.o. female here to establish care Patient's last menstrual period was 04/13/2024 (within days). Period Cycle (Days): 28 Period Duration (Days): 3-5 Period Pattern: Regular Menstrual Flow: Light, Moderate, Heavy Menstrual Control: Maxi pad Dysmenorrhea: (!) Mild Dysmenorrhea Symptoms: Cramping, Nausea (sometimes)  Reports she is bleeding for 3-5 days a month and using 4-5 pads a day No Gi/GU complaints Declines birth control Periods were heavier in the past Does report fatigue on her cycle and in the past had pain with her fibroids after intercourse Currently not sexually active. Gardesil: not completed: counseled on importance and brochure given.  She will consider STI: HSV2 on valtrex . 4 outbreaks a year (job was more stressful this past year) MMG 2020 had US  for breast pain. Reports she is still having b/l pain with her periods No family history of gyn or breast cancer Pap: remotes remote abnormal ?year.  Last pap smear 2020 Labs: PCP in May Would like STI testing with annual Birth control: declines  Body mass index is 26.69 kg/m.    Blood pressure 122/86, pulse 95, height 5' 4.76 (1.645 m), weight 159 lb 3.2 oz (72.2 kg), last menstrual period 04/13/2024, SpO2 98%.     Component Value Date/Time   DIAGPAP  11/04/2018 0000    NEGATIVE FOR INTRAEPITHELIAL LESIONS OR MALIGNANCY.   ADEQPAP  11/04/2018 0000    Satisfactory for evaluation  endocervical/transformation zone component PRESENT.    GYN HISTORY:    Component Value Date/Time   DIAGPAP  11/04/2018 0000    NEGATIVE FOR INTRAEPITHELIAL LESIONS OR MALIGNANCY.   ADEQPAP  11/04/2018 0000    Satisfactory for evaluation  endocervical/transformation zone component PRESENT.    OB History  Gravida Para Term Preterm AB Living  2 2 2   2   SAB IAB Ectopic Multiple Live Births      2    # Outcome Date GA Lbr Len/2nd Weight Sex Type Anes PTL Lv  2 Term 12/19/16    M Vag-Spont   LIV  1 Term 08/19/04    F  Vag-Spont   LIV    Past Medical History:  Diagnosis Date   Fibroids    Herpes simplex virus (HSV) infection    Hypertension in pregnancy, preeclampsia     Past Surgical History:  Procedure Laterality Date   NO PAST SURGERIES      Current Outpatient Medications on File Prior to Visit  Medication Sig Dispense Refill   buPROPion  (WELLBUTRIN  SR) 150 MG 12 hr tablet Take 1 tablet (150 mg total) by mouth 2 (two) times daily. Extended-release (SR) tablet) 150 mg orally once daily in the morning for 3 days, then increase to 150 mg orally twice daily, 180 tablet 0   No current facility-administered medications on file prior to visit.    Social History   Socioeconomic History   Marital status: Single    Spouse name: Not on file   Number of children: Not on file   Years of education: Not on file   Highest education level: Some college, no degree  Occupational History   Not on file  Tobacco Use   Smoking status: Every Day    Current packs/day: 0.50    Types: Cigarettes   Smokeless tobacco: Never  Vaping Use   Vaping status: Former   Substances: Nicotine, Flavoring  Substance and Sexual Activity   Alcohol use: Yes    Comment: occasionally   Drug use: Not Currently    Types: Marijuana   Sexual activity:  Not Currently    Birth control/protection: None  Other Topics Concern   Not on file  Social History Narrative   Not on file   Social Drivers of Health   Financial Resource Strain: Low Risk  (02/09/2024)   Overall Financial Resource Strain (CARDIA)    Difficulty of Paying Living Expenses: Not hard at all  Food Insecurity: No Food Insecurity (02/09/2024)   Hunger Vital Sign    Worried About Running Out of Food in the Last Year: Never true    Ran Out of Food in the Last Year: Never true  Transportation Needs: No Transportation Needs (02/09/2024)   PRAPARE - Administrator, Civil Service (Medical): No    Lack of Transportation (Non-Medical): No  Physical Activity:  Sufficiently Active (02/09/2024)   Exercise Vital Sign    Days of Exercise per Week: 5 days    Minutes of Exercise per Session: 100 min  Stress: Stress Concern Present (02/09/2024)   [name] of Occupational Health - Occupational Stress Questionnaire    Feeling of Stress : To some extent  Social Connections: Unknown (02/09/2024)   Social Connection and Isolation Panel    Frequency of Communication with Friends and Family: More than three times a week    Frequency of Social Gatherings with Friends and Family: More than three times a week    Attends Religious Services: Not on Insurance claims handler of Clubs or Organizations: No    Attends Banker Meetings: Not on file    Marital Status: Never married  Intimate Partner Violence: Not on file    Family History  Problem Relation Age of Onset   Hypertension Mother    CVA Mother    Colon cancer Paternal Grandfather      No Known Allergies    Patient's last menstrual period was Patient's last menstrual period was 04/13/2024 (within days)..            Review of Systems Alls systems reviewed and are negative.     OBGyn Exam    A:         Establish care                             P:        History reviewed To return for annual exam and patient desires STI testing at that time TO have PUS to evaluate fibroids Labs: see orders Breast pain: b/l to get dx mammogram. Screening mammogram in April at age 51 Gardesil: counseled and brochure given. She will consider  30 minutes spent on reviewing records, imaging,  and one on one patient time and counseling patient and documentation Dr. Glennon   No follow-ups on file.  Alicia Spears

## 2024-05-12 ENCOUNTER — Other Ambulatory Visit

## 2024-05-17 ENCOUNTER — Other Ambulatory Visit

## 2024-05-17 ENCOUNTER — Encounter

## 2024-05-24 ENCOUNTER — Other Ambulatory Visit

## 2024-05-26 ENCOUNTER — Ambulatory Visit: Admitting: Obstetrics and Gynecology

## 2024-06-09 ENCOUNTER — Ambulatory Visit
Admission: EM | Admit: 2024-06-09 | Discharge: 2024-06-09 | Disposition: A | Discharge status: Home / Self Care | Attending: Physician Assistant | Admitting: Physician Assistant

## 2024-06-09 ENCOUNTER — Encounter: Payer: Self-pay | Admitting: Emergency Medicine

## 2024-06-09 DIAGNOSIS — J0110 Acute frontal sinusitis, unspecified: Secondary | ICD-10-CM | POA: Diagnosis not present

## 2024-06-09 MED ORDER — AMOXICILLIN-POT CLAVULANATE 875-125 MG PO TABS: 1.0000 | ORAL_TABLET | Freq: Two times a day (BID) | ORAL | 0 refills | Status: AC

## 2024-06-09 NOTE — ED Provider Notes (Signed)
 [facility]    CSN: [id number] Arrival date & time: 06/09/24  0951      History   Chief Complaint Chief Complaint  Patient presents with   Nasal Congestion   Facial Pain    HPI Alicia Spears is a 38 y.o. female.   Patient presents today for evaluation of nasal congestion, sinus pressure and pain and cough that started about 4 days ago.  She notes symptoms have been waxing and waning.  She reports she is also had some fatigue.  She denies any body aches or fevers.  She notes that she has had some increased sinus pressure when she was bending over.  She has tried Mucinex DM with mild relief.  The history is provided by the patient.    Past Medical History:  Diagnosis Date   Fibroids    Herpes simplex virus (HSV) infection    Hypertension in pregnancy, preeclampsia     Patient Active Problem List   Diagnosis Date Noted   Hypertension during pregnancy 10/26/2018   Essential hypertension 10/26/2018    Past Surgical History:  Procedure Laterality Date   NO PAST SURGERIES      OB History     Gravida  2   Para  2   Term  2   Preterm      AB      Living  2      SAB      IAB      Ectopic      Multiple      Live Births  2            Home Medications    Prior to Admission medications   Medication Sig Start Date End Date Taking? Authorizing Provider  amoxicillin -clavulanate (AUGMENTIN ) 875-125 MG tablet Take 1 tablet by mouth every 12 (twelve) hours. 06/09/24  Yes Billy Asberry FALCON, PA-C  buPROPion  (WELLBUTRIN  SR) 150 MG 12 hr tablet Take 1 tablet (150 mg total) by mouth 2 (two) times daily. Extended-release (SR) tablet) 150 mg orally once daily in the morning for 3 days, then increase to 150 mg orally twice daily, 02/10/24   Sherlynn Madden, MD    Family History Family History  Problem Relation Age of Onset   Hypertension Mother    CVA Mother    Colon cancer Paternal Grandfather     Social History Social History   Tobacco  Use   Smoking status: Every Day    Current packs/day: 0.50    Types: Cigarettes   Smokeless tobacco: Never  Vaping Use   Vaping status: Former   Substances: Nicotine, Flavoring  Substance Use Topics   Alcohol use: Yes    Comment: occasionally   Drug use: Not Currently    Types: Marijuana     Allergies   Patient has no known allergies.   Review of Systems Review of Systems  Constitutional:  Negative for chills and fever.  HENT:  Positive for congestion and sinus pressure. Negative for ear pain and sore throat.   Eyes:  Negative for discharge and redness.  Respiratory:  Positive for cough. Negative for shortness of breath and wheezing.   Gastrointestinal:  Negative for abdominal pain, diarrhea, nausea and vomiting.     Physical Exam Triage Vital Signs ED Triage Vitals [06/09/24 1015]  Encounter Vitals Group     BP (!) 154/90     Girls Systolic BP Percentile      Girls Diastolic BP Percentile  Boys Systolic BP Percentile      Boys Diastolic BP Percentile      Pulse Rate 74     Resp 16     Temp 98 F (36.7 C)     Temp Source Oral     SpO2 100 %     Weight      Height      Head Circumference      Peak Flow      Pain Score 0     Pain Loc      Pain Education      Exclude from Growth Chart    No data found.  Updated Vital Signs BP (!) 148/85 (BP Location: Left Arm)   Pulse 74   Temp 98 F (36.7 C) (Oral)   Resp 16   LMP 06/07/2024 (Exact Date)   SpO2 100%   Visual Acuity Right Eye Distance:   Left Eye Distance:   Bilateral Distance:    Right Eye Near:   Left Eye Near:    Bilateral Near:     Physical Exam Vitals and nursing note reviewed.  Constitutional:      General: She is not in acute distress.    Appearance: Normal appearance. She is not ill-appearing.  HENT:     Head: Normocephalic and atraumatic.     Nose: Congestion present.     Mouth/Throat:     Mouth: Mucous membranes are moist.     Pharynx: No oropharyngeal exudate or posterior  oropharyngeal erythema.  Eyes:     Conjunctiva/sclera: Conjunctivae normal.  Cardiovascular:     Rate and Rhythm: Normal rate and regular rhythm.     Heart sounds: Normal heart sounds. No murmur heard. Pulmonary:     Effort: Pulmonary effort is normal. No respiratory distress.     Breath sounds: Normal breath sounds. No wheezing, rhonchi or rales.  Skin:    General: Skin is warm and dry.  Neurological:     Mental Status: She is alert.  Psychiatric:        Mood and Affect: Mood normal.        Thought Content: Thought content normal.      UC Treatments / Results  Labs (all labs ordered are listed, but only abnormal results are displayed) Labs Reviewed - No data to display  EKG   Radiology No results found.  Procedures Procedures (including critical care time)  Medications Ordered in UC Medications - No data to display  Initial Impression / Assessment and Plan / UC Course  I have reviewed the triage vital signs and the nursing notes.  Pertinent labs & imaging results that were available during my care of the patient were reviewed by me and considered in my medical decision making (see chart for details).    Given presentation will treat to cover sinusitis with Augmentin .  Advise follow-up if no gradual improvement or with any further concerns.  Final Clinical Impressions(s) / UC Diagnoses   Final diagnoses:  Acute non-recurrent frontal sinusitis   Discharge Instructions   None    ED Prescriptions     Medication Sig Dispense Auth. Provider   amoxicillin -clavulanate (AUGMENTIN ) 875-125 MG tablet Take 1 tablet by mouth every 12 (twelve) hours. 14 tablet Billy Asberry FALCON, PA-C      PDMP not reviewed this encounter.   Billy Asberry FALCON, PA-C 06/09/24 1207

## 2024-06-09 NOTE — ED Triage Notes (Signed)
 Pt reports nasal congestion, sinus pressure/pain, dry cough, postnasal drainage, and fatigue x 4 days. Denies body aches, fevers, chills, or sore throat. Pt taking mucinex dm with some relief. 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

## 2024-06-23 ENCOUNTER — Ambulatory Visit: Admitting: Obstetrics and Gynecology
